# Patient Record
Sex: Female | Born: 1990
Health system: Southern US, Community
[De-identification: ages and names within clinical notes are randomized; demographics above are authoritative.]

## PROBLEM LIST (undated history)

## (undated) DIAGNOSIS — I10 Essential (primary) hypertension: Secondary | ICD-10-CM

## (undated) DIAGNOSIS — D649 Anemia, unspecified: Secondary | ICD-10-CM

## (undated) DIAGNOSIS — J45909 Unspecified asthma, uncomplicated: Secondary | ICD-10-CM

---
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
## 2013-11-26 ENCOUNTER — Emergency Department (INDEPENDENT_AMBULATORY_CARE_PROVIDER_SITE_OTHER)
Admission: EM | Admit: 2013-11-26 | Discharge: 2013-11-26 | Disposition: A | Discharge status: Home / Self Care | Payer: Self-pay | Source: Home / Self Care | Attending: Family Medicine | Admitting: Family Medicine

## 2013-11-26 ENCOUNTER — Encounter (HOSPITAL_COMMUNITY): Payer: Self-pay | Admitting: Emergency Medicine

## 2013-11-26 DIAGNOSIS — J45909 Unspecified asthma, uncomplicated: Secondary | ICD-10-CM

## 2013-11-26 MED ORDER — ALBUTEROL SULFATE HFA 108 (90 BASE) MCG/ACT IN AERS: 1.0000 | INHALATION_SPRAY | Freq: Four times a day (QID) | RESPIRATORY_TRACT | Status: DC | PRN

## 2013-11-26 MED ORDER — IPRATROPIUM-ALBUTEROL 0.5-2.5 (3) MG/3ML IN SOLN
3.0000 mL | Freq: Once | RESPIRATORY_TRACT | Status: AC
  Administered 2013-11-26: 3 mL via RESPIRATORY_TRACT

## 2013-11-26 MED ORDER — ALBUTEROL SULFATE HFA 108 (90 BASE) MCG/ACT IN AERS
INHALATION_SPRAY | RESPIRATORY_TRACT | Status: AC
  Filled 2013-11-26: qty 6.7

## 2013-11-26 MED ORDER — IPRATROPIUM-ALBUTEROL 0.5-2.5 (3) MG/3ML IN SOLN
RESPIRATORY_TRACT | Status: AC
  Filled 2013-11-26: qty 3

## 2013-11-26 MED ORDER — ALBUTEROL SULFATE HFA 108 (90 BASE) MCG/ACT IN AERS: 2.0000 | INHALATION_SPRAY | RESPIRATORY_TRACT | Status: DC

## 2013-11-26 NOTE — ED Notes (Signed)
Pt  Has  A  History  Of  Asthma   She reports  A  Flair  Up that  Has not been releived  By  Her  Inhalers      She  Reports  It  May    Be  A  Flair  Up  Of  Her  Allergies

## 2013-11-26 NOTE — ED Provider Notes (Signed)
Medical screening examination/treatment/procedure(s) were performed by a resident physician or non-physician practitioner and as the supervising physician I was immediately available for consultation/collaboration.  Evan Corey, MD    Evan S Corey, MD 11/26/13 2015 

## 2013-11-26 NOTE — ED Provider Notes (Signed)
CSN: [id number]     Arrival date & time 11/26/13  1039 History   First MD Initiated Contact with Patient 11/26/13 1113     Chief Complaint  Patient presents with  . Shortness of Breath   (Consider location/radiation/quality/duration/timing/severity/associated sxs/prior Treatment) Patient is a 23 y.o. female presenting with shortness of breath. The history is provided by the patient. No language interpreter was used.  Shortness of Breath Severity:  Moderate Onset quality:  Gradual Duration:  1 day Timing:  Constant Progression:  Worsening Chronicity:  New Context: URI   Relieved by:  Nothing Worsened by:  Nothing tried Ineffective treatments:  None tried Associated symptoms: wheezing   Associated symptoms: no chest pain   Risk factors: no tobacco use     Past Medical History  Diagnosis Date  . Asthma   . Hypertension   . Diabetes mellitus   . Anemia    History reviewed. No pertinent past surgical history. No family history on file. History  Substance Use Topics  . Smoking status: Passive Smoke Exposure - Never Smoker    Types: Cigarettes  . Smokeless tobacco: Not on file  . Alcohol Use: No   OB History   Grav Para Term Preterm Abortions TAB SAB Ect Mult Living                 Review of Systems  Respiratory: Positive for shortness of breath and wheezing.   Cardiovascular: Negative for chest pain.  All other systems reviewed and are negative.   Allergies  Review of patient's allergies indicates no known allergies.  Home Medications   Prior to Admission medications   Medication Sig Start Date End Date Taking? Authorizing Provider  albuterol (PROVENTIL HFA;VENTOLIN HFA) 108 (90 BASE) MCG/ACT inhaler Inhale 1 puff into the lungs every 6 (six) hours as needed. For shortness of breath    Historical Provider, MD   BP 142/66  Pulse 98  Temp(Src) 98.6 F (37 C) (Oral)  Resp 20  SpO2 98%  LMP 11/09/2013 Physical Exam  Constitutional: She appears well-developed  and well-nourished.  HENT:  Head: Normocephalic and atraumatic.  Eyes: Conjunctivae are normal. Pupils are equal, round, and reactive to light.  Neck: Normal range of motion. Neck supple.  Cardiovascular: Normal rate and normal heart sounds.   Pulmonary/Chest: Effort normal. She has wheezes.  Abdominal: Soft.  Musculoskeletal: Normal range of motion.  Neurological: She is alert.  Skin: Skin is warm.    ED Course  Procedures (including critical care time) Labs Review Labs Reviewed - No data to display  Imaging Review No results found.   MDM   1. Asthma    Pt given a duoneb.   Pt feels better Rx for albuterol  Albuterol inhaler given here  Elson AreasLeslie K Sofia, PA-C 11/26/13 1228

## 2013-11-26 NOTE — Discharge Instructions (Signed)
Asthma Asthma is a recurring condition in which the airways tighten and narrow. Asthma can make it difficult to breathe. It can cause coughing, wheezing, and shortness of breath. Asthma episodes, also called asthma attacks, range from minor to life-threatening. Asthma cannot be cured, but medicines and lifestyle changes can help control it. CAUSES Asthma is believed to be caused by inherited (genetic) and environmental factors, but its exact cause is unknown. Asthma may be triggered by allergens, lung infections, or irritants in the air. Asthma triggers are different for each person. Common triggers include:   Animal dander.  Dust mites.  Cockroaches.  Pollen from trees or grass.  Mold.  Smoke.  Air pollutants such as dust, household cleaners, hair sprays, aerosol sprays, paint fumes, strong chemicals, or strong odors.  Cold air, weather changes, and winds (which increase molds and pollens in the air).  Strong emotional expressions such as crying or laughing hard.  Stress.  Certain medicines (such as aspirin) or types of drugs (such as beta-blockers).  Sulfites in foods and drinks. Foods and drinks that may contain sulfites include dried fruit, potato chips, and sparkling grape juice.  Infections or inflammatory conditions such as the flu, a cold, or an inflammation of the nasal membranes (rhinitis).  Gastroesophageal reflux disease (GERD).  Exercise or strenuous activity. SYMPTOMS Symptoms may occur immediately after asthma is triggered or many hours later. Symptoms include:  Wheezing.  Excessive nighttime or early morning coughing.  Frequent or severe coughing with a common cold.  Chest tightness.  Shortness of breath. DIAGNOSIS  The diagnosis of asthma is made by a review of your medical history and a physical exam. Tests may also be performed. These may include:  Lung function studies. These tests show how much air you breathe in and out.  Allergy  tests.  Imaging tests such as X-rays. TREATMENT  Asthma cannot be cured, but it can usually be controlled. Treatment involves identifying and avoiding your asthma triggers. It also involves medicines. There are 2 classes of medicine used for asthma treatment:   Controller medicines. These prevent asthma symptoms from occurring. They are usually taken every day.  Reliever or rescue medicines. These quickly relieve asthma symptoms. They are used as needed and provide short-term relief. Your health care provider will help you create an asthma action plan. An asthma action plan is a written plan for managing and treating your asthma attacks. It includes a list of your asthma triggers and how they may be avoided. It also includes information on when medicines should be taken and when their dosage should be changed. An action plan may also involve the use of a device called a peak flow meter. A peak flow meter measures how well the lungs are working. It helps you monitor your condition. HOME CARE INSTRUCTIONS   Take medicine as directed by your health care provider. Speak with your health care provider if you have questions about how or when to take the medicines.  Use a peak flow meter as directed by your health care provider. Record and keep track of readings.  Understand and use the action plan to help minimize or stop an asthma attack without needing to seek medical care.  Control your home environment in the following ways to help prevent asthma attacks:  Do not smoke. Avoid being exposed to secondhand smoke.  Change your heating and air conditioning filter regularly.  Limit your use of fireplaces and wood stoves.  Get rid of pests (such as roaches and mice)  and their droppings.  Throw away plants if you see mold on them.  Clean your floors and dust regularly. Use unscented cleaning products.  Try to have someone else vacuum for you regularly. Stay out of rooms while they are being  vacuumed and for a short while afterward. If you vacuum, use a dust mask from a hardware store, a double-layered or microfilter vacuum cleaner bag, or a vacuum cleaner with a HEPA filter.  Replace carpet with wood, tile, or vinyl flooring. Carpet can trap dander and dust.  Use allergy-proof pillows, mattress covers, and box spring covers.  Wash bed sheets and blankets every week in hot water and dry them in a dryer.  Use blankets that are made of polyester or cotton.  Clean bathrooms and kitchens with bleach. If possible, have someone repaint the walls in these rooms with mold-resistant paint. Keep out of the rooms that are being cleaned and painted.  Wash hands frequently. SEEK MEDICAL CARE IF:   You have wheezing, shortness of breath, or a cough even if taking medicine to prevent attacks.  The colored mucus you cough up (sputum) is thicker than usual.  Your sputum changes from clear or white to yellow, green, gray, or bloody.  You have any problems that may be related to the medicines you are taking (such as a rash, itching, swelling, or trouble breathing).  You are using a reliever medicine more than 2-3 times per week.  Your peak flow is still at 50-79% of your personal best after following your action plan for 1 hour. SEEK IMMEDIATE MEDICAL CARE IF:   You seem to be getting worse and are unresponsive to treatment during an asthma attack.  You are short of breath even at rest.  You get short of breath when doing very little physical activity.  You have difficulty eating, drinking, or talking due to asthma symptoms.  You develop chest pain.  You develop a fast heartbeat.  You have a bluish color to your lips or fingernails.  You are lightheaded, dizzy, or faint.  Your peak flow is less than 50% of your personal best.  You have a fever or persistent symptoms for more than 2-3 days.  You have a fever and symptoms suddenly get worse. MAKE SURE YOU:   Understand  these instructions.  Will watch your condition.  Will get help right away if you are not doing well or get worse. Document Released: 05/29/2005 Document Revised: 06/03/2013 Document Reviewed: 12/26/2012 [facility]Exit[facility] Patient Information 2015 [place], [place]. This information is not intended to replace advice given to you by your health care provider. Make sure you discuss any questions you have with your health care provider.  Asthma Attack Prevention Although there is no way to prevent asthma from starting, you can take steps to control the disease and reduce its symptoms. Learn about your asthma and how to control it. Take an active role to control your asthma by working with your health care provider to create and follow an asthma action plan. An asthma action plan guides you in:  Taking your medicines properly.  Avoiding things that set off your asthma or make your asthma worse (asthma triggers).  Tracking your level of asthma control.  Responding to worsening asthma.  Seeking emergency care when needed. To track your asthma, keep records of your symptoms, check your peak flow number using a handheld device that shows how well air moves out of your lungs (peak flow meter), and get regular asthma checkups.  WHAT ARE SOME WAYS TO PREVENT AN ASTHMA ATTACK?  Take medicines as directed by your health care provider.  Keep track of your asthma symptoms and level of control.  With your health care provider, write a detailed plan for taking medicines and managing an asthma attack. Then be sure to follow your action plan. Asthma is an ongoing condition that needs regular monitoring and treatment.  Identify and avoid asthma triggers. Many outdoor allergens and irritants (such as pollen, mold, cold air, and air pollution) can trigger asthma attacks. Find out what your asthma triggers are and take steps to avoid them.  Monitor your breathing. Learn to recognize warning signs of an attack, such as  coughing, wheezing, or shortness of breath. Your lung function may decrease before you notice any signs or symptoms, so regularly measure and record your peak airflow with a home peak flow meter.  Identify and treat attacks early. If you act quickly, you are less likely to have a severe attack. You will also need less medicine to control your symptoms. When your peak flow measurements decrease and alert you to an upcoming attack, take your medicine as instructed and immediately stop any activity that may have triggered the attack. If your symptoms do not improve, get medical help.  Pay attention to increasing quick-relief inhaler use. If you find yourself relying on your quick-relief inhaler, your asthma is not under control. See your health care provider about adjusting your treatment. WHAT CAN MAKE MY SYMPTOMS WORSE? A number of common things can set off or make your asthma symptoms worse and cause temporary increased inflammation of your airways. Keep track of your asthma symptoms for several weeks, detailing all the environmental and emotional factors that are linked with your asthma. When you have an asthma attack, go back to your asthma diary to see which factor, or combination of factors, might have contributed to it. Once you know what these factors are, you can take steps to control many of them. If you have allergies and asthma, it is important to take asthma prevention steps at home. Minimizing contact with the substance to which you are allergic will help prevent an asthma attack. Some triggers and ways to avoid these triggers are: Animal Dander:  Some people are allergic to the flakes of skin or dried saliva from animals with fur or feathers.   There is no such thing as a hypoallergenic dog or cat breed. All dogs or cats can cause allergies, even if they don't shed.  Keep these pets out of your home.  If you are not able to keep a pet outdoors, keep the pet out of your bedroom and other  sleeping areas at all times, and keep the door closed.  Remove carpets and furniture covered with cloth from your home. If that is not possible, keep the pet away from fabric-covered furniture and carpets. Dust Mites: Many people with asthma are allergic to dust mites. Dust mites are tiny bugs that are found in every home in mattresses, pillows, carpets, fabric-covered furniture, bedcovers, clothes, stuffed toys, and other fabric-covered items.   Cover your mattress in a special dust-proof cover.  Cover your pillow in a special dust-proof cover, or wash the pillow each week in hot water. Water must be hotter than 130 F (54.4 C) to kill dust mites. Cold or warm water used with detergent and bleach can also be effective.  Wash the sheets and blankets on your bed each week in hot water.  Try not  to sleep or lie on cloth-covered cushions.  Call ahead when traveling and ask for a smoke-free hotel room. Bring your own bedding and pillows in case the hotel only supplies feather pillows and down comforters, which may contain dust mites and cause asthma symptoms.  Remove carpets from your bedroom and those laid on concrete, if you can.  Keep stuffed toys out of the bed, or wash the toys weekly in hot water or cooler water with detergent and bleach. Cockroaches: Many people with asthma are allergic to the droppings and remains of cockroaches.   Keep food and garbage in closed containers. Never leave food out.  Use poison baits, traps, powders, gels, or paste (for example, boric acid).  If a spray is used to kill cockroaches, stay out of the room until the odor goes away. Indoor Mold:  Fix leaky faucets, pipes, or other sources of water that have mold around them.  Clean floors and moldy surfaces with a fungicide or diluted bleach.  Avoid using humidifiers, vaporizers, or swamp coolers. These can spread molds through the air. Pollen and Outdoor Mold:  When pollen or mold spore counts are  high, try to keep your windows closed.  Stay indoors with windows closed from late morning to afternoon. Pollen and some mold spore counts are highest at that time.  Ask your health care provider whether you need to take anti-inflammatory medicine or increase your dose of the medicine before your allergy season starts. Other Irritants to Avoid:  Tobacco smoke is an irritant. If you smoke, ask your health care provider how you can quit. Ask family members to quit smoking, too. Do not allow smoking in your home or car.  If possible, do not use a wood-burning stove, kerosene heater, or fireplace. Minimize exposure to all sources of smoke, including incense, candles, fires, and fireworks.  Try to stay away from strong odors and sprays, such as perfume, talcum powder, hair spray, and paints.  Decrease humidity in your home and use an indoor air cleaning device. Reduce indoor humidity to below 60%. Dehumidifiers or central air conditioners can do this.  Decrease house dust exposure by changing furnace and air cooler filters frequently.  Try to have someone else vacuum for you once or twice a week. Stay out of rooms while they are being vacuumed and for a short while afterward.  If you vacuum, use a dust mask from a hardware store, a double-layered or microfilter vacuum cleaner bag, or a vacuum cleaner with a HEPA filter.  Sulfites in foods and beverages can be irritants. Do not drink beer or wine or eat dried fruit, processed potatoes, or shrimp if they cause asthma symptoms.  Cold air can trigger an asthma attack. Cover your nose and mouth with a scarf on cold or windy days.  Several health conditions can make asthma more difficult to manage, including a runny nose, sinus infections, reflux disease, psychological stress, and sleep apnea. Work with your health care provider to manage these conditions.  Avoid close contact with people who have a respiratory infection such as a cold or the flu,  since your asthma symptoms may get worse if you catch the infection. Wash your hands thoroughly after touching items that may have been handled by people with a respiratory infection.  Get a flu shot every year to protect against the flu virus, which often makes asthma worse for days or weeks. Also get a pneumonia shot if you have not previously had one. Unlike the flu  shot, the pneumonia shot does not need to be given yearly. Medicines:  Talk to your health care provider about whether it is safe for you to take aspirin or non-steroidal anti-inflammatory medicines (NSAIDs). In a small number of people with asthma, aspirin and NSAIDs can cause asthma attacks. These medicines must be avoided by people who have known aspirin-sensitive asthma. It is important that people with aspirin-sensitive asthma read labels of all over-the-counter medicines used to treat pain, colds, coughs, and fever.  Beta-blockers and ACE inhibitors are other medicines you should discuss with your health care provider. HOW CAN I FIND OUT WHAT I AM ALLERGIC TO? Ask your asthma health care provider about allergy skin testing or blood testing (the RAST test) to identify the allergens to which you are sensitive. If you are found to have allergies, the most important thing to do is to try to avoid exposure to any allergens that you are sensitive to as much as possible. Other treatments for allergies, such as medicines and allergy shots (immunotherapy) are available.  CAN I EXERCISE? Follow your health care provider's advice regarding asthma treatment before exercising. It is important to maintain a regular exercise program, but vigorous exercise or exercise in cold, humid, or dry environments can cause asthma attacks, especially for those people who have exercise-induced asthma. Document Released: 05/17/2009 Document Revised: 06/03/2013 Document Reviewed: 12/04/2012 [facility] CenterExit[facility] Patient Information 2015 [place], [place]. This information is  not intended to replace advice given to you by your health care provider. Make sure you discuss any questions you have with your health care provider.

## 2014-09-07 ENCOUNTER — Encounter (HOSPITAL_COMMUNITY): Payer: Self-pay | Admitting: Emergency Medicine

## 2014-09-07 ENCOUNTER — Emergency Department (INDEPENDENT_AMBULATORY_CARE_PROVIDER_SITE_OTHER)
Admission: EM | Admit: 2014-09-07 | Discharge: 2014-09-07 | Disposition: A | Discharge status: Home / Self Care | Payer: Self-pay | Source: Home / Self Care | Attending: Emergency Medicine | Admitting: Emergency Medicine

## 2014-09-07 ENCOUNTER — Emergency Department (HOSPITAL_COMMUNITY)
Admission: EM | Admit: 2014-09-07 | Discharge: 2014-09-08 | Disposition: A | Discharge status: Home / Self Care | Payer: Self-pay | Attending: Emergency Medicine | Admitting: Emergency Medicine

## 2014-09-07 ENCOUNTER — Encounter (HOSPITAL_COMMUNITY): Payer: Self-pay | Admitting: *Deleted

## 2014-09-07 ENCOUNTER — Emergency Department (HOSPITAL_COMMUNITY): Payer: Self-pay

## 2014-09-07 DIAGNOSIS — Z79899 Other long term (current) drug therapy: Secondary | ICD-10-CM | POA: Insufficient documentation

## 2014-09-07 DIAGNOSIS — I10 Essential (primary) hypertension: Secondary | ICD-10-CM | POA: Insufficient documentation

## 2014-09-07 DIAGNOSIS — Z862 Personal history of diseases of the blood and blood-forming organs and certain disorders involving the immune mechanism: Secondary | ICD-10-CM | POA: Insufficient documentation

## 2014-09-07 DIAGNOSIS — J45901 Unspecified asthma with (acute) exacerbation: Secondary | ICD-10-CM | POA: Insufficient documentation

## 2014-09-07 DIAGNOSIS — J069 Acute upper respiratory infection, unspecified: Secondary | ICD-10-CM | POA: Insufficient documentation

## 2014-09-07 DIAGNOSIS — R0602 Shortness of breath: Secondary | ICD-10-CM

## 2014-09-07 DIAGNOSIS — R000 Tachycardia, unspecified: Secondary | ICD-10-CM | POA: Insufficient documentation

## 2014-09-07 DIAGNOSIS — B349 Viral infection, unspecified: Secondary | ICD-10-CM

## 2014-09-07 DIAGNOSIS — E119 Type 2 diabetes mellitus without complications: Secondary | ICD-10-CM | POA: Insufficient documentation

## 2014-09-07 LAB — CBC WITH DIFFERENTIAL/PLATELET
BASOS ABS: 0 10*3/uL (ref 0.0–0.1)
Basophils Relative: 0 % (ref 0–1)
EOS PCT: 0 % (ref 0–5)
Eosinophils Absolute: 0 10*3/uL (ref 0.0–0.7)
HEMATOCRIT: 34.2 % — AB (ref 36.0–46.0)
Hemoglobin: 11.2 g/dL — ABNORMAL LOW (ref 12.0–15.0)
LYMPHS PCT: 5 % — AB (ref 12–46)
Lymphs Abs: 0.7 10*3/uL (ref 0.7–4.0)
MCH: 25.5 pg — ABNORMAL LOW (ref 26.0–34.0)
MCHC: 32.7 g/dL (ref 30.0–36.0)
MCV: 77.9 fL — AB (ref 78.0–100.0)
MONO ABS: 0.7 10*3/uL (ref 0.1–1.0)
Monocytes Relative: 5 % (ref 3–12)
NEUTROS ABS: 12.3 10*3/uL — AB (ref 1.7–7.7)
Neutrophils Relative %: 90 % — ABNORMAL HIGH (ref 43–77)
PLATELETS: 268 10*3/uL (ref 150–400)
RBC: 4.39 MIL/uL (ref 3.87–5.11)
RDW: 13.4 % (ref 11.5–15.5)
WBC: 13.8 10*3/uL — ABNORMAL HIGH (ref 4.0–10.5)

## 2014-09-07 MED ORDER — ALBUTEROL SULFATE (2.5 MG/3ML) 0.083% IN NEBU
5.0000 mg | INHALATION_SOLUTION | Freq: Once | RESPIRATORY_TRACT | Status: AC
  Administered 2014-09-07: 5 mg via RESPIRATORY_TRACT
  Filled 2014-09-07: qty 6

## 2014-09-07 MED ORDER — PREDNISONE 20 MG PO TABS
60.0000 mg | ORAL_TABLET | Freq: Once | ORAL | Status: AC
  Administered 2014-09-07: 60 mg via ORAL
  Filled 2014-09-07: qty 3

## 2014-09-07 MED ORDER — IPRATROPIUM BROMIDE 0.02 % IN SOLN
0.5000 mg | Freq: Once | RESPIRATORY_TRACT | Status: AC
  Administered 2014-09-07: 0.5 mg via RESPIRATORY_TRACT
  Filled 2014-09-07: qty 2.5

## 2014-09-07 MED ORDER — IBUPROFEN 800 MG PO TABS: 800.0000 mg | ORAL_TABLET | Freq: Three times a day (TID) | ORAL | Status: DC

## 2014-09-07 MED ORDER — ACETAMINOPHEN 325 MG PO TABS
650.0000 mg | ORAL_TABLET | Freq: Once | ORAL | Status: AC
  Administered 2014-09-07: 650 mg via ORAL
  Filled 2014-09-07: qty 2

## 2014-09-07 MED ORDER — SODIUM CHLORIDE 0.9 % IV BOLUS (SEPSIS)
1000.0000 mL | Freq: Once | INTRAVENOUS | Status: AC
  Administered 2014-09-07: 1000 mL via INTRAVENOUS

## 2014-09-07 MED ORDER — BENZONATATE 100 MG PO CAPS: 100.0000 mg | ORAL_CAPSULE | Freq: Three times a day (TID) | ORAL | Status: DC

## 2014-09-07 NOTE — Discharge Instructions (Signed)
Cough, Adult ° A cough is a reflex that helps clear your throat and airways. It can help heal the body or may be a reaction to an irritated airway. A cough may only last 2 or 3 weeks (acute) or may last more than 8 weeks (chronic).  °CAUSES °Acute cough: °· Viral or bacterial infections. °Chronic cough: °· Infections. °· Allergies. °· Asthma. °· Post-nasal drip. °· Smoking. °· Heartburn or acid reflux. °· Some medicines. °· Chronic lung problems (COPD). °· Cancer. °SYMPTOMS  °· Cough. °· Fever. °· Chest pain. °· Increased breathing rate. °· High-pitched whistling sound when breathing (wheezing). °· Colored mucus that you cough up (sputum). °TREATMENT  °· A bacterial cough may be treated with antibiotic medicine. °· A viral cough must run its course and will not respond to antibiotics. °· Your caregiver may recommend other treatments if you have a chronic cough. °HOME CARE INSTRUCTIONS  °· Only take over-the-counter or prescription medicines for pain, discomfort, or fever as directed by your caregiver. Use cough suppressants only as directed by your caregiver. °· Use a cold steam vaporizer or humidifier in your bedroom or home to help loosen secretions. °· Sleep in a semi-upright position if your cough is worse at night. °· Rest as needed. °· Stop smoking if you smoke. °SEEK IMMEDIATE MEDICAL CARE IF:  °· You have pus in your sputum. °· Your cough starts to worsen. °· You cannot control your cough with suppressants and are losing sleep. °· You begin coughing up blood. °· You have difficulty breathing. °· You develop pain which is getting worse or is uncontrolled with medicine. °· You have a fever. °MAKE SURE YOU:  °· Understand these instructions. °· Will watch your condition. °· Will get help right away if you are not doing well or get worse. °Document Released: 11/25/2010 Document Revised: 08/21/2011 Document Reviewed: 11/25/2010 °ExitCare® Patient Information ©2015 [employer]. This information is not intended  to replace advice given to you by your health care provider. Make sure you discuss any questions you have with your health care provider. °Viral Infections °A virus is a type of germ. Viruses can cause: °· Minor sore throats. °· Aches and pains. °· Headaches. °· Runny nose. °· Rashes. °· Watery eyes. °· Tiredness. °· Coughs. °· Loss of appetite. °· Feeling sick to your stomach (nausea). °· Throwing up (vomiting). °· Watery poop (diarrhea). °HOME CARE  °· Only take medicines as told by your doctor. °· Drink enough water and fluids to keep your pee (urine) clear or pale yellow. Sports drinks are a good choice. °· Get plenty of rest and eat healthy. Soups and broths with crackers or rice are fine. °GET HELP RIGHT AWAY IF:  °· You have a very bad headache. °· You have shortness of breath. °· You have chest pain or neck pain. °· You have an unusual rash. °· You cannot stop throwing up. °· You have watery poop that does not stop. °· You cannot keep fluids down. °· You or your child has a temperature by mouth above 102° F (38.9° C), not controlled by medicine. °· Your baby is older than 3 months with a rectal temperature of 102° F (38.9° C) or higher. °· Your baby is 3 months old or younger with a rectal temperature of 100.4° F (38° C) or higher. °MAKE SURE YOU:  °· Understand these instructions. °· Will watch this condition. °· Will get help right away if you are not doing well or get worse. °Document Released:   05/11/2008 Document Revised: 08/21/2011 Document Reviewed: 10/04/2010 ExitCare Patient Information 2015 [place], [place]. This information is not intended to replace advice given to you by your health care provider. Make sure you discuss any questions you have with your health care provider.

## 2014-09-07 NOTE — ED Notes (Signed)
Pt states she has had a sore throat, cough, and fever since Saturday. Alert and oriented.

## 2014-09-07 NOTE — ED Provider Notes (Signed)
CSN: [id number]     Arrival date & time 09/07/14  1417 History   First MD Initiated Contact with Patient 09/07/14 1458     Chief Complaint  Patient presents with  . URI   (Consider location/radiation/quality/duration/timing/severity/associated sxs/prior Treatment) Patient is a 24 y.o. female presenting with URI. The history is provided by the patient. No language interpreter was used.  URI Presenting symptoms: congestion and cough   Severity:  Moderate Onset quality:  Gradual Duration:  3 days Timing:  Constant Progression:  Worsening Chronicity:  New Relieved by:  Nothing Worsened by:  Nothing tried Ineffective treatments:  None tried Associated symptoms: no sinus pain and no sneezing   Risk factors: no sick contacts     Past Medical History  Diagnosis Date  . Asthma   . Hypertension   . Diabetes mellitus   . Anemia    No past surgical history on file. No family history on file. History  Substance Use Topics  . Smoking status: Passive Smoke Exposure - Never Smoker    Types: Cigarettes  . Smokeless tobacco: Not on file  . Alcohol Use: No   OB History    No data available     Review of Systems  HENT: Positive for congestion. Negative for sneezing.   Respiratory: Positive for cough.   All other systems reviewed and are negative.   Allergies  Review of patient's allergies indicates no known allergies.  Home Medications   Prior to Admission medications   Medication Sig Start Date End Date Taking? Authorizing Provider  albuterol (PROVENTIL HFA;VENTOLIN HFA) 108 (90 BASE) MCG/ACT inhaler Inhale 1 puff into the lungs every 6 (six) hours as needed. For shortness of breath 11/26/13   Elson AreasLeslie K Orrin Yurkovich, PA-C   BP 124/83 mmHg  Pulse 119  Temp(Src) 101.2 F (38.4 C) (Oral)  Resp 18  SpO2 98% Physical Exam  Constitutional: She appears well-developed and well-nourished.  HENT:  Head: Normocephalic.  Right Ear: External ear normal.  Left Ear: External ear normal.   Eyes: Pupils are equal, round, and reactive to light.  Neck: Neck supple.  Cardiovascular: Normal rate.   Pulmonary/Chest: Effort normal.  Abdominal: Soft.  Musculoskeletal: Normal range of motion.  Skin: Skin is warm.  Psychiatric: She has a normal mood and affect.  Nursing note and vitals reviewed.   ED Course  Procedures (including critical care time) Labs Review Labs Reviewed - No data to display  Imaging Review No results found.   MDM Pt counseled  I suspect viral illness.  Pt given rx for ibuprofen and tessalon perles.   1. Viral illness      Elson AreasLeslie K Aimy Sweeting, PA-C 09/07/14 1535

## 2014-09-07 NOTE — ED Provider Notes (Signed)
CSN: [id number]     Arrival date & time 09/07/14  2224 History   First MD Initiated Contact with Patient 09/07/14 2257     Chief Complaint  Patient presents with  . Sore Throat  . Cough  . Fever     (Consider location/radiation/quality/duration/timing/severity/associated sxs/prior Treatment) HPI    PCP: REDMON,NOELLE, PA-C Blood pressure 131/65, pulse 141, temperature 100.9 F (38.3 C), temperature source Oral, last menstrual period 08/10/2014, SpO2 99 %.  Marilyn Wiley is a 24 y.o.female with a significant PMH of asthma, hypertension, diabetes, anemia  presents to the ER with complaints of chills, nausea, diaphoresis and fever at home. She also has had a mildly scratchy throat and cough. She was seen in the ER earlier today by an UC provider for URI symptoms of congestion and cough. She has been sick for the past 3 days with worsening symptoms of congestion and cough. No vomiting or diarrhea. In Triage she is febrile at 100.9 and tachycardic at 141.   Negative Review of Symptoms: No LE swelling, no CP, no back pain, dysuria, or headache, neck pain, confusion, rash, myalgias/arthralgias.   Past Medical History  Diagnosis Date  . Asthma   . Hypertension   . Diabetes mellitus   . Anemia    History reviewed. No pertinent past surgical history. History reviewed. No pertinent family history. History  Substance Use Topics  . Smoking status: Passive Smoke Exposure - Never Smoker    Types: Cigarettes  . Smokeless tobacco: Not on file  . Alcohol Use: No   OB History    No data available     Review of Systems  10 Systems reviewed and are negative for acute change except as noted in the HPI.     Allergies  Review of patient's allergies indicates no known allergies.  Home Medications   Prior to Admission medications   Medication Sig Start Date End Date Taking? Authorizing Provider  albuterol (PROVENTIL HFA;VENTOLIN HFA) 108 (90 BASE) MCG/ACT inhaler Inhale 1 puff into  the lungs every 6 (six) hours as needed. For shortness of breath 11/26/13  Yes Elson Areas, PA-C  benzonatate (TESSALON) 100 MG capsule Take 1 capsule (100 mg total) by mouth every 8 (eight) hours. 09/07/14  Yes Lonia Skinner Sofia, PA-C  ibuprofen (ADVIL,MOTRIN) 800 MG tablet Take 1 tablet (800 mg total) by mouth 3 (three) times daily. 09/07/14  Yes Elson Areas, PA-C  norethindrone-ethinyl estradiol (OVCON-35,BALZIVA,BRIELLYN) 0.4-35 MG-MCG tablet Take 1 tablet by mouth daily.   Yes Historical Provider, MD  OVER THE COUNTER MEDICATION Take 2 capsules by mouth daily. OTC. The ODD SHOT Weight LOss MEdication   Yes Historical Provider, MD   BP 131/65 mmHg  Pulse 141  Temp(Src) 100.9 F (38.3 C) (Oral)  SpO2 99%  LMP 08/10/2014 Physical Exam  Constitutional: She appears well-developed and well-nourished. She appears distressed.  HENT:  Head: Normocephalic and atraumatic.  Right Ear: Tympanic membrane and ear canal normal.  Left Ear: Tympanic membrane and ear canal normal.  Nose: Nose normal.  Mouth/Throat: Uvula is midline, oropharynx is clear and moist and mucous membranes are normal.  Eyes: Pupils are equal, round, and reactive to light.  Neck: Normal range of motion. Neck supple.  Cardiovascular: Regular rhythm.  Tachycardia present.   Pulmonary/Chest: Effort normal. No respiratory distress. She has wheezes (diffuse).  Abdominal: Soft. Bowel sounds are normal. She exhibits no distension. There is no tenderness. There is no rigidity.  Neurological: She is alert.  Skin: Skin  is warm. No rash noted. She is diaphoretic.  Psychiatric: She has a normal mood and affect. Her speech is normal.  Nursing note and vitals reviewed.   ED Course  Procedures (including critical care time) Labs Review Labs Reviewed  RAPID STREP SCREEN  CBC WITH DIFFERENTIAL/PLATELET  BASIC METABOLIC PANEL    Imaging Review No results found.   EKG Interpretation None      MDM   Final diagnoses:   Shortness of breath    Pt is tachycardic and has significant wheezing with fever. She was seen today at the Urgent Care and is now complaining of being significantly worse.  Medications  sodium chloride 0.9 % bolus 1,000 mL (1,000 mLs Intravenous New Bag/Given 09/07/14 2315)  acetaminophen (TYLENOL) tablet 650 mg (650 mg Oral Given 09/07/14 2333)  predniSONE (DELTASONE) tablet 60 mg (60 mg Oral Given 09/07/14 2333)  albuterol (PROVENTIL) (2.5 MG/3ML) 0.083% nebulizer solution 5 mg (5 mg Nebulization Given 09/07/14 2333)  ipratropium (ATROVENT) nebulizer solution 0.5 mg (0.5 mg Nebulization Given 09/07/14 2333)    1:45 am The patient has a negative urine preg, and lactic acid. She has had a CBC which shows a mildly elevated WBC and a mildly abnormal BMP.  Her chest xray shows no acute pulmonary process. Unfortunately despite 2 L of fluid the patient continues to be tachycardic - initially at 141 and now at 126.    2:30 am At the end of shift, patient handed off to Genuine PartsShari Upstill, PA-C. D-dimer is pending. Strep screen is pending. If these are normal and fever/tachycardia continues to be improved patient can be discharged to f/u with PCP. Dicussed plan with patient after sign out, well appearing and symptoms improved.  Filed Vitals:   09/08/14 0146  BP: 128/48  Pulse: 126  Temp: 98.6 F (37 C)  Resp: [id number]      Kayde Atkerson, PA-C 09/08/14 0231  Paula LibraJohn Molpus, MD 09/08/14 [phone]

## 2014-09-07 NOTE — ED Notes (Signed)
PT   REPORTS  SYMPTOMS  OF  COUGH  / CONGESTED    RUNNY  NOSE    AS  WELL  AS        HEADACHES   /    CHILLS        WITH    SNEEZING     pT  REPORTS  SYMPTOMS BEGAN       ABOUT  3  DAYS  AGO      PT  AMBULATED  TO  ROOM  WITH  A  STEADY  FLYUID GAIT  SPEAKING IN  COMPLETE  SENTANCES  IN NO ACUTE  DISTRESS

## 2014-09-08 ENCOUNTER — Emergency Department (HOSPITAL_COMMUNITY): Payer: Self-pay

## 2014-09-08 ENCOUNTER — Encounter (HOSPITAL_COMMUNITY): Payer: Self-pay

## 2014-09-08 LAB — BASIC METABOLIC PANEL
ANION GAP: 11 (ref 5–15)
BUN: <5 mg/dL — ABNORMAL LOW (ref 6–23)
CO2: 21 mmol/L (ref 19–32)
CREATININE: 0.92 mg/dL (ref 0.50–1.10)
Calcium: 8.8 mg/dL (ref 8.4–10.5)
Chloride: 101 mmol/L (ref 96–112)
GFR calc Af Amer: >90 mL/min (ref >90)
GFR, EST NON AFRICAN AMERICAN: 88 mL/min — AB (ref >90)
Glucose, Bld: 123 mg/dL — ABNORMAL HIGH (ref 70–99)
Potassium: 3.2 mmol/L — ABNORMAL LOW (ref 3.5–5.1)
Sodium: 133 mmol/L — ABNORMAL LOW (ref 135–145)

## 2014-09-08 LAB — RAPID STREP SCREEN (MED CTR MEBANE ONLY): STREPTOCOCCUS, GROUP A SCREEN (DIRECT): NEGATIVE

## 2014-09-08 LAB — POC URINE PREG, ED: Preg Test, Ur: NEGATIVE

## 2014-09-08 LAB — D-DIMER, QUANTITATIVE: D-Dimer, Quant: 0.98 ug/mL-FEU — ABNORMAL HIGH (ref 0.00–0.48)

## 2014-09-08 LAB — I-STAT CG4 LACTIC ACID, ED: Lactic Acid, Venous: 1.57 mmol/L (ref 0.5–2.0)

## 2014-09-08 MED ORDER — IOHEXOL 350 MG/ML SOLN
100.0000 mL | Freq: Once | INTRAVENOUS | Status: AC | PRN
  Administered 2014-09-08: 100 mL via INTRAVENOUS

## 2014-09-08 MED ORDER — SODIUM CHLORIDE 0.9 % IV BOLUS (SEPSIS)
1000.0000 mL | Freq: Once | INTRAVENOUS | Status: AC
  Administered 2014-09-08: 1000 mL via INTRAVENOUS

## 2014-09-08 NOTE — Discharge Instructions (Signed)
Upper Respiratory Infection, Adult An upper respiratory infection (URI) is also sometimes known as the common cold. The upper respiratory tract includes the nose, sinuses, throat, trachea, and bronchi. Bronchi are the airways leading to the lungs. Most people improve within 1 week, but symptoms can last up to 2 weeks. A residual cough may last even longer.  CAUSES Many different viruses can infect the tissues lining the upper respiratory tract. The tissues become irritated and inflamed and often become very moist. Mucus production is also common. A cold is contagious. You can easily spread the virus to others by oral contact. This includes kissing, sharing a glass, coughing, or sneezing. Touching your mouth or nose and then touching a surface, which is then touched by another person, can also spread the virus. SYMPTOMS  Symptoms typically develop 1 to 3 days after you come in contact with a cold virus. Symptoms vary from person to person. They may include:  Runny nose.  Sneezing.  Nasal congestion.  Sinus irritation.  Sore throat.  Loss of voice (laryngitis).  Cough.  Fatigue.  Muscle aches.  Loss of appetite.  Headache.  Low-grade fever. DIAGNOSIS  You might diagnose your own cold based on familiar symptoms, since most people get a cold 2 to 3 times a year. Your caregiver can confirm this based on your exam. Most importantly, your caregiver can check that your symptoms are not due to another disease such as strep throat, sinusitis, pneumonia, asthma, or epiglottitis. Blood tests, throat tests, and X-rays are not necessary to diagnose a common cold, but they may sometimes be helpful in excluding other more serious diseases. Your caregiver will decide if any further tests are required. RISKS AND COMPLICATIONS  You may be at risk for a more severe case of the common cold if you smoke cigarettes, have chronic heart disease (such as heart failure) or lung disease (such as asthma), or if  you have a weakened immune system. The very young and very old are also at risk for more serious infections. Bacterial sinusitis, middle ear infections, and bacterial pneumonia can complicate the common cold. The common cold can worsen asthma and chronic obstructive pulmonary disease (COPD). Sometimes, these complications can require emergency medical care and may be life-threatening. PREVENTION  The best way to protect against getting a cold is to practice good hygiene. Avoid oral or hand contact with people with cold symptoms. Wash your hands often if contact occurs. There is no clear evidence that vitamin C, vitamin E, echinacea, or exercise reduces the chance of developing a cold. However, it is always recommended to get plenty of rest and practice good nutrition. TREATMENT  Treatment is directed at relieving symptoms. There is no cure. Antibiotics are not effective, because the infection is caused by a virus, not by bacteria. Treatment may include:  Increased fluid intake. Sports drinks offer valuable electrolytes, sugars, and fluids.  Breathing heated mist or steam (vaporizer or shower).  Eating chicken soup or other clear broths, and maintaining good nutrition.  Getting plenty of rest.  Using gargles or lozenges for comfort.  Controlling fevers with ibuprofen or acetaminophen as directed by your caregiver.  Increasing usage of your inhaler if you have asthma. Zinc gel and zinc lozenges, taken in the first 24 hours of the common cold, can shorten the duration and lessen the severity of symptoms. Pain medicines may help with fever, muscle aches, and throat pain. A variety of non-prescription medicines are available to treat congestion and runny nose. Your caregiver   can make recommendations and may suggest nasal or lung inhalers for other symptoms.  HOME CARE INSTRUCTIONS   Only take over-the-counter or prescription medicines for pain, discomfort, or fever as directed by your  caregiver.  Use a warm mist humidifier or inhale steam from a shower to increase air moisture. This may keep secretions moist and make it easier to breathe.  Drink enough water and fluids to keep your urine clear or pale yellow.  Rest as needed.  Return to work when your temperature has returned to normal or as your caregiver advises. You may need to stay home longer to avoid infecting others. You can also use a face mask and careful hand washing to prevent spread of the virus. SEEK MEDICAL CARE IF:   After the first few days, you feel you are getting worse rather than better.  You need your caregiver's advice about medicines to control symptoms.  You develop chills, worsening shortness of breath, or brown or red sputum. These may be signs of pneumonia.  You develop yellow or brown nasal discharge or pain in the face, especially when you bend forward. These may be signs of sinusitis.  You develop a fever, swollen neck glands, pain with swallowing, or white areas in the back of your throat. These may be signs of strep throat. SEEK IMMEDIATE MEDICAL CARE IF:   You have a fever.  You develop severe or persistent headache, ear pain, sinus pain, or chest pain.  You develop wheezing, a prolonged cough, cough up blood, or have a change in your usual mucus (if you have chronic lung disease).  You develop sore muscles or a stiff neck. Document Released: 11/22/2000 Document Revised: 08/21/2011 Document Reviewed: 09/03/2013 Exit[employer] Patient Information 2015 [employer]. This information is not intended to replace advice given to you by your health care provider. Make sure you discuss any questions you have with your health care provider.  

## 2014-09-08 NOTE — ED Provider Notes (Signed)
Cough, congestion, fever. Sent home from [facility] with Tessalon, supportive care.  Subsequent rigors, chills, fever. Here tachy to 140, sore throat, cough, congestion. CXR, lactic, BMET - negative. 3L provided, still tachy to 120. Strep, d-dimer pending. Patient urinating. Re-evaluate after final labs.   D-dimer positive (.98). Will obtain CT angio r/o PE.  CT Angio negative. She is feeling much better with IV fluids. Heart rate significantly improved. Feel she is stable for discharge home.   Elpidio AnisShari Kazuo Durnil, PA-C 09/08/14 [phone]

## 2014-09-10 LAB — CULTURE, GROUP A STREP

## 2015-04-12 ENCOUNTER — Emergency Department (HOSPITAL_COMMUNITY)
Admission: EM | Admit: 2015-04-12 | Discharge: 2015-04-12 | Disposition: A | Discharge status: Home / Self Care | Payer: Self-pay | Attending: Emergency Medicine | Admitting: Emergency Medicine

## 2015-04-12 ENCOUNTER — Encounter (HOSPITAL_COMMUNITY): Payer: Self-pay | Admitting: Emergency Medicine

## 2015-04-12 ENCOUNTER — Emergency Department (HOSPITAL_COMMUNITY): Payer: Self-pay

## 2015-04-12 DIAGNOSIS — S8992XA Unspecified injury of left lower leg, initial encounter: Secondary | ICD-10-CM | POA: Insufficient documentation

## 2015-04-12 DIAGNOSIS — Y9339 Activity, other involving climbing, rappelling and jumping off: Secondary | ICD-10-CM | POA: Insufficient documentation

## 2015-04-12 DIAGNOSIS — Y998 Other external cause status: Secondary | ICD-10-CM | POA: Insufficient documentation

## 2015-04-12 DIAGNOSIS — Z791 Long term (current) use of non-steroidal anti-inflammatories (NSAID): Secondary | ICD-10-CM | POA: Insufficient documentation

## 2015-04-12 DIAGNOSIS — Z79899 Other long term (current) drug therapy: Secondary | ICD-10-CM | POA: Insufficient documentation

## 2015-04-12 DIAGNOSIS — X58XXXA Exposure to other specified factors, initial encounter: Secondary | ICD-10-CM | POA: Insufficient documentation

## 2015-04-12 DIAGNOSIS — I10 Essential (primary) hypertension: Secondary | ICD-10-CM | POA: Insufficient documentation

## 2015-04-12 DIAGNOSIS — Y9289 Other specified places as the place of occurrence of the external cause: Secondary | ICD-10-CM | POA: Insufficient documentation

## 2015-04-12 DIAGNOSIS — J45909 Unspecified asthma, uncomplicated: Secondary | ICD-10-CM | POA: Insufficient documentation

## 2015-04-12 MED ORDER — IBUPROFEN 800 MG PO TABS
800.0000 mg | ORAL_TABLET | Freq: Once | ORAL | Status: AC
  Administered 2015-04-12: 800 mg via ORAL

## 2015-04-12 MED ORDER — IBUPROFEN 800 MG PO TABS: 800.0000 mg | ORAL_TABLET | Freq: Three times a day (TID) | ORAL | Status: DC

## 2015-04-12 MED ORDER — HYDROCODONE-ACETAMINOPHEN 5-325 MG PO TABS: 2.0000 | ORAL_TABLET | ORAL | Status: DC | PRN

## 2015-04-12 NOTE — ED Notes (Signed)
Patient was alert, oriented and stable upon discharge. RN went over AVS and patient had no further questions.  

## 2015-04-12 NOTE — ED Notes (Signed)
Pt states she is having left knee pain  Pt states it was hurting her a little last week so she took some time off from working out  Pt states today her knee popped and now it feels unstable to stand on  Pt states she is unable to stand on it at this time

## 2015-04-12 NOTE — Discharge Instructions (Signed)
Take vicodin and ibuprofen as needed for pain. Rest, ice, and elevate your injury. Refer to attached documents for more information. Follow up with Dr. Magnus IvanBlackman if symptoms persist.

## 2015-04-12 NOTE — ED Provider Notes (Signed)
History  By signing my name below, I, Karle PlumberJennifer Tensley, attest that this documentation has been prepared under the direction and in the presence of AK [employer] Holding CorporationKaitlyn Josceline Chenard, PA-C. Electronically Signed: Karle PlumberJennifer Tensley, ED Scribe. 04/12/2015. 9:33 PM.  Chief Complaint  Patient presents with  . Knee Pain   The history is provided by the patient and medical records. No language interpreter was used.    HPI Comments:  Marilyn Wiley is a 24 y.o. female who presents to the Emergency Department complaining of moderate left knee pain that started a few hours ago. She states she was doing jumping jacks and landed on the leg wrong. She states that most of the pain is located laterally. She has not done anything to treat the pain. Moving the leg or bearing weight makes the pain worse. She denies numbness, tingling or weakness of the LLE, bruising or wounds.   Past Medical History  Diagnosis Date  . Asthma   . Hypertension   . Anemia    History reviewed. No pertinent past surgical history. Family History  Problem Relation Age of Onset  . Diabetes Mother   . Diabetes Other   . Cancer Other    Social History  Substance Use Topics  . Smoking status: Passive Smoke Exposure - Never Smoker    Types: Cigarettes  . Smokeless tobacco: None  . Alcohol Use: Yes     Comment: social    OB History    No data available     Review of Systems  Musculoskeletal: Positive for arthralgias.  All other systems reviewed and are negative.   Allergies  Review of patient's allergies indicates no known allergies.  Home Medications   Prior to Admission medications   Medication Sig Start Date End Date Taking? Authorizing Provider  albuterol (PROVENTIL HFA;VENTOLIN HFA) 108 (90 BASE) MCG/ACT inhaler Inhale 1 puff into the lungs every 6 (six) hours as needed. For shortness of breath 11/26/13   Elson AreasLeslie K Sofia, PA-C  benzonatate (TESSALON) 100 MG capsule Take 1 capsule (100 mg total) by mouth every 8 (eight)  hours. 09/07/14   Elson AreasLeslie K Sofia, PA-C  ibuprofen (ADVIL,MOTRIN) 800 MG tablet Take 1 tablet (800 mg total) by mouth 3 (three) times daily. 09/07/14   Elson AreasLeslie K Sofia, PA-C  norethindrone-ethinyl estradiol (OVCON-35,BALZIVA,BRIELLYN) 0.4-35 MG-MCG tablet Take 1 tablet by mouth daily.    Historical Provider, MD  OVER THE COUNTER MEDICATION Take 2 capsules by mouth daily. OTC. The ODD SHOT Weight LOss MEdication    Historical Provider, MD   Triage Vitals: BP 142/58 mmHg  Pulse 98  Temp(Src) 98.6 F (37 C) (Oral)  Resp 18  Ht 5\' 4"  (1.626 m)  Wt 184 lb (83.462 kg)  BMI 31.57 kg/m2  SpO2 100%  LMP 03/24/2015 (Exact Date) Physical Exam  Constitutional: She is oriented to person, place, and time. She appears well-developed and well-nourished. No distress.  HENT:  Head: Normocephalic and atraumatic.  Eyes: Conjunctivae and EOM are normal.  Normal appearance  Neck: Normal range of motion.  Cardiovascular: Normal rate and regular rhythm.  Exam reveals no gallop and no friction rub.   No murmur heard. Pulmonary/Chest: Effort normal and breath sounds normal. She has no wheezes. She has no rales. She exhibits no tenderness.  Abdominal: Soft. She exhibits no distension. There is no tenderness. There is no rebound.  Musculoskeletal: Normal range of motion.  Limited flexion of the left knee with lateral left knee tenderness to palpation. No obvious deformity.   Neurological: She  is alert and oriented to person, place, and time. Coordination normal.  Speech is goal-oriented. Moves limbs without ataxia.   Skin: Skin is warm and dry.  Psychiatric: She has a normal mood and affect. Her behavior is normal.  Nursing note and vitals reviewed.   ED Course  Procedures (including critical care time)   SPLINT APPLICATION Date/Time: 9:28 PM Authorized by: Emilia Beck Consent: Verbal consent obtained. Risks and benefits: risks, benefits and alternatives were discussed Consent given by:  patient Splint applied by: orthopedic technician Location details: left knee Splint type: knee immobilizer Supplies used: knee immobilizer Post-procedure: The splinted body part was neurovascularly unchanged following the procedure. Patient tolerance: Patient tolerated the procedure well with no immediate complications.     DIAGNOSTIC STUDIES: Oxygen Saturation is 100% on RA, normal by my interpretation.   COORDINATION OF CARE: 9:21 PM- Will order knee immobilizer, crutches and give referral to orthopedist. Will prescribe pain medication. Pt verbalizes understanding and agrees to plan.  Medications - No data to display  Labs Review Labs Reviewed - No data to display  Imaging Review Dg Knee Complete 4 Views Left  04/12/2015  CLINICAL DATA:  Left knee pain following jumping jacks, initial encounter EXAM: LEFT KNEE - COMPLETE 4+ VIEW COMPARISON:  None. FINDINGS: There is no evidence of fracture, dislocation, or joint effusion. There is no evidence of arthropathy or other focal bone abnormality. Soft tissues are unremarkable. IMPRESSION: No acute abnormality noted. Electronically Signed   By: Alcide Clever M.D.   On: 04/12/2015 20:04   I have personally reviewed and evaluated these images and lab results as part of my medical decision-making.   EKG Interpretation None      MDM   Final diagnoses:  Left knee injury, initial encounter    9:29 PM Patient is unable to bear weight. Patient's xray unremarkable for acute changes. Patient has significant tenderness to the lateral aspect of the left knee. Patient will have knee immobilizer and crutches and orthopedic follow up.   I personally performed the services described in this documentation, which was scribed in my presence. The recorded information has been reviewed and is accurate.     Emilia Beck, PA-C 04/12/15 1610  Bethann Berkshire, MD 04/12/15 [phone]

## 2015-10-04 ENCOUNTER — Ambulatory Visit: Payer: Self-pay | Admitting: Allergy and Immunology

## 2015-10-07 ENCOUNTER — Ambulatory Visit (INDEPENDENT_AMBULATORY_CARE_PROVIDER_SITE_OTHER): Payer: 59 | Admitting: Allergy and Immunology

## 2015-10-07 ENCOUNTER — Encounter: Payer: Self-pay | Admitting: Allergy and Immunology

## 2015-10-07 VITALS — BP 110/68 | HR 80 | Temp 98.4°F | Resp 18 | Ht 64.0 in | Wt 188.1 lb

## 2015-10-07 DIAGNOSIS — J3089 Other allergic rhinitis: Secondary | ICD-10-CM | POA: Insufficient documentation

## 2015-10-07 DIAGNOSIS — J4530 Mild persistent asthma, uncomplicated: Secondary | ICD-10-CM | POA: Insufficient documentation

## 2015-10-07 DIAGNOSIS — H1045 Other chronic allergic conjunctivitis: Secondary | ICD-10-CM | POA: Diagnosis not present

## 2015-10-07 DIAGNOSIS — H1010 Acute atopic conjunctivitis, unspecified eye: Secondary | ICD-10-CM | POA: Insufficient documentation

## 2015-10-07 MED ORDER — ALBUTEROL SULFATE 108 (90 BASE) MCG/ACT IN AEPB: 2.0000 | INHALATION_SPRAY | RESPIRATORY_TRACT | Status: AC | PRN

## 2015-10-07 MED ORDER — LEVOCETIRIZINE DIHYDROCHLORIDE 5 MG PO TABS: 5.0000 mg | ORAL_TABLET | Freq: Every day | ORAL | Status: DC

## 2015-10-07 MED ORDER — MONTELUKAST SODIUM 10 MG PO TABS: 10.0000 mg | ORAL_TABLET | Freq: Every day | ORAL | Status: DC

## 2015-10-07 MED ORDER — FLUTICASONE PROPIONATE 50 MCG/ACT NA SUSP: 2.0000 | Freq: Every day | NASAL | Status: DC

## 2015-10-07 MED ORDER — OLOPATADINE HCL 0.7 % OP SOLN: 1.0000 [drp] | OPHTHALMIC | Status: DC

## 2015-10-07 MED ORDER — BECLOMETHASONE DIPROPIONATE 40 MCG/ACT IN AERS: 2.0000 | INHALATION_SPRAY | Freq: Two times a day (BID) | RESPIRATORY_TRACT | Status: DC

## 2015-10-07 NOTE — Assessment & Plan Note (Signed)
   Treatment plan as outlined above.  A prescription has been provided for Pazeo, one drop per eye daily as needed. 

## 2015-10-07 NOTE — Assessment & Plan Note (Addendum)
Today's spirometry results, assessed while asymptomatic, suggest under-perception of dyspnea.  A prescription has been provided for montelukast 10 mg daily at bedtime.  During respiratory tract infections and asthma flares, the patient may add Qvar 40 g, 2 inhalations via spacer device twice a day until symptoms have returned to baseline.  A prescription for Qvar 40 g has been provided.  A prescription has been provided for ProAir Respiclick, 1-2 inhalations every 4-6 hours as needed.  Subjective and objective measures of pulmonary function will be followed and the treatment plan will be adjusted accordingly.

## 2015-10-07 NOTE — Patient Instructions (Addendum)
Perennial and seasonal allergic rhinitis  Aeroallergen avoidance measures have been discussed and provided in written form.  A prescription has been provided for levocetirizine, 5 mg daily as needed.  A prescription has been provided for fluticasone nasal spray, 2 sprays per nostril daily as needed. Proper nasal spray technique has been discussed and demonstrated.  The risks and benefits of aeroallergen immunotherapy have been discussed. The patient is motivated to initiate immunotherapy if insurance coverage is favorable. She will let us know how she would like to proceed.  Seasonal allergic conjunctivitis  Treatment plan as outlined above.  A prescription has been provided for Pazeo, one drop per eye daily as needed.  Mild persistent asthma Today's spirometry results, assessed while asymptomatic, suggest under-perception of dyspnea.  A prescription has been provided for montelukast 10 mg daily at bedtime.  During respiratory tract infections and asthma flares, the patient may add Qvar 40 g, 2 inhalations via spacer device twice a day until symptoms have returned to baseline.  A prescription for Qvar 40 g has been provided.  A prescription has been provided for ProAir Respiclick, 1-2 inhalations every 4-6 hours as needed.  Subjective and objective measures of pulmonary function will be followed and the treatment plan will be adjusted accordingly.    Return in about 2 months (around 12/07/2015).  Reducing Pollen Exposure  The American Academy of Allergy, Asthma and Immunology suggests the following steps to reduce your exposure to pollen during allergy seasons.    1. Do not hang sheets or clothing out to dry; pollen may collect on these items. 2. Do not mow lawns or spend time around freshly cut grass; mowing stirs up pollen. 3. Keep windows closed at night.  Keep car windows closed while driving. 4. Minimize morning activities outdoors, a time when pollen counts are usually at  their highest. 5. Stay indoors as much as possible when pollen counts or humidity is high and on windy days when pollen tends to remain in the air longer. 6. Use air conditioning when possible.  Many air conditioners have filters that trap the pollen spores. 7. Use a HEPA room air filter to remove pollen form the indoor air you breathe.   Control of House Dust Mite Allergen  House dust mites play a major role in allergic asthma and rhinitis.  They occur in environments with high humidity wherever human skin, the food for dust mites is found. High levels have been detected in dust obtained from mattresses, pillows, carpets, upholstered furniture, bed covers, clothes and soft toys.  The principal allergen of the house dust mite is found in its feces.  A gram of dust may contain 1,000 mites and 250,000 fecal particles.  Mite antigen is easily measured in the air during house cleaning activities.    1. Encase mattresses, including the box spring, and pillow, in an air tight cover.  Seal the zipper end of the encased mattresses with wide adhesive tape. 2. Wash the bedding in water of 130 degrees Farenheit weekly.  Avoid cotton comforters/quilts and flannel bedding: the most ideal bed covering is the dacron comforter. 3. Remove all upholstered furniture from the bedroom. 4. Remove carpets, carpet padding, rugs, and non-washable window drapes from the bedroom.  Wash drapes weekly or use plastic window coverings. 5. Remove all non-washable stuffed toys from the bedroom.  Wash stuffed toys weekly. 6. Have the room cleaned frequently with a vacuum cleaner and a damp dust-mop.  The patient should not be in a room which  is being cleaned and should wait 1 hour after cleaning before going into the room. 7. Close and seal all heating outlets in the bedroom.  Otherwise, the room will become filled with dust-laden air.  An electric heater can be used to heat the room. Reduce indoor humidity to less than 50%.  Do  not use a humidifier.  Control of Dog or Cat Allergen  Avoidance is the best way to manage a dog or cat allergy. If you have a dog or cat and are allergic to dog or cats, consider removing the dog or cat from the home. If you have a dog or cat but don't want to find it a new home, or if your family wants a pet even though someone in the household is allergic, here are some strategies that may help keep symptoms at bay:  1. Keep the pet out of your bedroom and restrict it to only a few rooms. Be advised that keeping the dog or cat in only one room will not limit the allergens to that room. 2. Don't pet, hug or kiss the dog or cat; if you do, wash your hands with soap and water. 3. High-efficiency particulate air (HEPA) cleaners run continuously in a bedroom or living room can reduce allergen levels over time. 4. Regular use of a high-efficiency vacuum cleaner or a central vacuum can reduce allergen levels. 5. Giving your dog or cat a bath at least once a week can reduce airborne allergen.  Control of Mold Allergen  Mold and fungi can grow on a variety of surfaces provided certain temperature and moisture conditions exist.  Outdoor molds grow on plants, decaying vegetation and soil.  The major outdoor mold, Alternaria and Cladosporium, are found in very high numbers during hot and dry conditions.  Generally, a late Summer - Fall peak is seen for common outdoor fungal spores.  Rain will temporarily lower outdoor mold spore count, but counts rise rapidly when the rainy period ends.  The most important indoor molds are Aspergillus and Penicillium.  Dark, humid and poorly ventilated basements are ideal sites for mold growth.  The next most common sites of mold growth are the bathroom and the kitchen.  Outdoor [employer] 4. Use air conditioning and keep windows closed 5. Avoid exposure to decaying vegetation. 6. Avoid leaf raking. 7. Avoid grain handling. 8. Consider wearing a face mask if working  in moldy areas.  Indoor Mold Control 1. Maintain humidity below 50%. 2. Clean washable surfaces with 5% bleach solution. 3. Remove sources e.g. Contaminated carpets.  Control of Cockroach Allergen  Cockroach allergen has been identified as an important cause of acute attacks of asthma, especially in urban settings.  There are fifty-five species of cockroach that exist in the [place], however only three, the [place], [place] species produce allergen that can affect patients with Asthma.  Allergens can be obtained from fecal particles, egg casings and secretions from cockroaches.    1. Remove food sources. 2. Reduce access to water. 3. Seal access and entry points. 4. Spray runways with 0.5-1% Diazinon or Chlorpyrifos 5. Blow boric acid power under stoves and refrigerator. 6. Place bait stations (hydramethylnon) at feeding sites.

## 2015-10-07 NOTE — Assessment & Plan Note (Signed)
   Aeroallergen avoidance measures have been discussed and provided in written form.  A prescription has been provided for levocetirizine, 5 mg daily as needed.  A prescription has been provided for fluticasone nasal spray, 2 sprays per nostril daily as needed. Proper nasal spray technique has been discussed and demonstrated.  The risks and benefits of aeroallergen immunotherapy have been discussed. The patient is motivated to initiate immunotherapy if insurance coverage is favorable. She will let us know how she would like to proceed. 

## 2015-10-07 NOTE — Progress Notes (Signed)
New Patient Note  RE: Marilyn Wiley MRN: [id number] DOB: [birth year] Date of Office Visit: 10/07/2015  Referring provider: Milus Height, PA-C Primary care provider: REDMON,NOELLE, PA-C  Chief Complaint: Asthma and Allergic Rhinitis    History of present illness: HPI Comments: Marilyn Wiley is a 25 y.o. female presenting today for consultation of asthma and rhinitis.  She was diagnosed with asthma as a child.  Her asthma symptoms consist of coughing, dyspnea, chest tightness, and wheezing.  Her asthma symptoms are triggered by upper respiratory tract infections, exercise, as well as exposure to dust, cats, and pollens.  She currently experiences asthma symptoms one or 2 times per week on average and denies nocturnal awakenings due to lower respiratory symptoms.  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  Her most recent trip to the emergency department for an asthma exacerbation was in July 2015.  Marilyn Wiley experiences nasal congestion, rhinorrhea, sneezing, nasal pruritus, pharyngeal pruritus, ear canal pruritus, and ocular pruritus.  The symptoms occur year around but her more severe in the springtime and in the fall.   Assessment and plan: Perennial and seasonal allergic rhinitis  Aeroallergen avoidance measures have been discussed and provided in written form.  A prescription has been provided for levocetirizine, 5 mg daily as needed.  A prescription has been provided for fluticasone nasal spray, 2 sprays per nostril daily as needed. Proper nasal spray technique has been discussed and demonstrated.  The risks and benefits of aeroallergen immunotherapy have been discussed. The patient is motivated to initiate immunotherapy if insurance coverage is favorable. She will let us know how she would like to proceed.  Seasonal allergic conjunctivitis  Treatment plan as outlined above.  A  prescription has been provided for Pazeo, one drop per eye daily as needed.  Mild persistent asthma Today's spirometry results, assessed while asymptomatic, suggest under-perception of dyspnea.  A prescription has been provided for montelukast 10 mg daily at bedtime.  During respiratory tract infections and asthma flares, the patient may add Qvar 40 g, 2 inhalations via spacer device twice a day until symptoms have returned to baseline.  A prescription for Qvar 40 g has been provided.  A prescription has been provided for ProAir Respiclick, 1-2 inhalations every 4-6 hours as needed.  Subjective and objective measures of pulmonary function will be followed and the treatment plan will be adjusted accordingly.    Meds ordered this encounter  Medications  . levocetirizine (XYZAL) 5 MG tablet    Sig: Take 1 tablet (5 mg total) by mouth daily.    Dispense:  30 tablet    Refill:  5  . fluticasone (FLONASE) 50 MCG/ACT nasal spray    Sig: Place 2 sprays into both nostrils daily.    Dispense:  16 g    Refill:  5  . Olopatadine HCl (PAZEO) 0.7 % SOLN    Sig: Place 1 drop into both eyes 1 day or 1 dose.    Dispense:  1 Bottle    Refill:  5  . montelukast (SINGULAIR) 10 MG tablet    Sig: Take 1 tablet (10 mg total) by mouth at bedtime.    Dispense:  30 tablet    Refill:  5  . beclomethasone (QVAR) 40 MCG/ACT inhaler    Sig: Inhale 2 puffs into the lungs 2 (two) times daily.    Dispense:  1 Inhaler    Refill:  5  .  Albuterol Sulfate (PROAIR RESPICLICK) 108 (90 Base) MCG/ACT AEPB    Sig: Inhale 2 puffs into the lungs every 4 (four) hours as needed.    Dispense:  1 each    Refill:  1    Diagnositics: Spirometry: Spirometry reveals an FVC of 3.75 L and an FEV1 of 2.89 L with significant (320 mL) post bronchodilator improvement. Epicutaneous testing: Positive to grass pollens, tree pollens, cat hair, and dust mite antigen. Intradermal testing: Positive to ragweed pollen, weed pollen,  molds, dog epithelia, and cockroach antigen.    Physical examination: Blood pressure 110/68, pulse 80, temperature 98.4 F (36.9 C), temperature source Oral, resp. rate 18, height  (1.626 m), weight 188 lb 0.8 oz (85.3 kg).  General: Alert, interactive, in no acute distress. HEENT: TMs pearly gray, turbinates edematous and pale without discharge, post-pharynx moderately erythematous. Neck: Supple without lymphadenopathy. Lungs: Clear to auscultation without wheezing, rhonchi or rales. CV: Normal S1, S2 without murmurs. Abdomen: Nondistended, nontender. Skin: Warm and dry, without lesions or rashes. Extremities:  No clubbing, cyanosis or edema. Neuro:   Grossly intact.  Review of systems:  Review of Systems  Constitutional: Negative for fever, chills and weight loss.  HENT: Positive for congestion. Negative for nosebleeds.   Eyes: Negative for blurred vision.  Respiratory: Positive for cough, shortness of breath and wheezing. Negative for hemoptysis.   Cardiovascular: Negative for chest pain.  Gastrointestinal: Negative for diarrhea and constipation.  Genitourinary: Negative for dysuria.  Musculoskeletal: Negative for myalgias and joint pain.  Skin: Positive for itching.  Neurological: Negative for dizziness.  Endo/Heme/Allergies: Positive for environmental allergies. Does not bruise/bleed easily.    Past medical history:  Past Medical History  Diagnosis Date  . Asthma   . Hypertension   . Anemia     Past surgical history:  History reviewed. No pertinent past surgical history.  Family history: Family History  Problem Relation Age of Onset  . Diabetes Mother   . Asthma Mother   . Diabetes Other   . Cancer Other   . Asthma Brother     Social history: Social History   Social History  . Marital Status: Single    Spouse Name: N/A  . Number of Children: N/A  . Years of Education: N/A   Occupational History  . Not on file.   Social History Main Topics  .  Smoking status: Former Smoker    Types: Cigarettes  . Smokeless tobacco: Not on file     Comment: hooka smoking  . Alcohol Use: 0.0 oz/week    0 Standard drinks or equivalent per week     Comment: social   . Drug Use: No  . Sexual Activity: Not on file   Other Topics Concern  . Not on file   Social History Narrative   Environmental History: The patient lives in a house with carpeting throughout, gas heat, and central air.  She is a nonsmoker without pets.    Medication List       This list is accurate as of: 10/07/15  4:04 PM.  Always use your most recent med list.               albuterol 108 (90 Base) MCG/ACT inhaler  Commonly known as:  PROVENTIL HFA;VENTOLIN HFA  Inhale 1 puff into the lungs every 6 (six) hours as needed. For shortness of breath     Albuterol Sulfate 108 (90 Base) MCG/ACT Aepb  Commonly known as:  PROAIR RESPICLICK  Inhale 2  puffs into the lungs every 4 (four) hours as needed.     beclomethasone 40 MCG/ACT inhaler  Commonly known as:  QVAR  Inhale 2 puffs into the lungs 2 (two) times daily.     benzonatate 100 MG capsule  Commonly known as:  TESSALON  Take 1 capsule (100 mg total) by mouth every 8 (eight) hours.     fluticasone 50 MCG/ACT nasal spray  Commonly known as:  FLONASE  Place 2 sprays into both nostrils daily.     HYDROcodone-acetaminophen 5-325 MG tablet  Commonly known as:  NORCO/VICODIN  Take 2 tablets by mouth every 4 (four) hours as needed.     ibuprofen 800 MG tablet  Commonly known as:  ADVIL,MOTRIN  Take 1 tablet (800 mg total) by mouth 3 (three) times daily.     levocetirizine 5 MG tablet  Commonly known as:  XYZAL  Take 1 tablet (5 mg total) by mouth daily.     montelukast 10 MG tablet  Commonly known as:  SINGULAIR  Take 1 tablet (10 mg total) by mouth at bedtime.     norethindrone-ethinyl estradiol 0.4-35 MG-MCG tablet  Commonly known as:  OVCON-35,BALZIVA,BRIELLYN  Take 1 tablet by mouth daily. Reported on  10/07/2015     Olopatadine HCl 0.7 % Soln  Commonly known as:  PAZEO  Place 1 drop into both eyes 1 day or 1 dose.     OVER THE COUNTER MEDICATION  Take 2 capsules by mouth daily. Reported on 10/07/2015        Known medication allergies: No Known Allergies  I appreciate the opportunity to take part in this Rhema's care. Please do not hesitate to contact me with questions.  Sincerely,   R. Jorene Guestarter Trejon Duford, MD

## 2016-05-16 IMAGING — CR DG KNEE COMPLETE 4+V*L*
4 series · 4 of 4 positions shown · non-contrast
Comparison: None.

CLINICAL DATA: Left knee pain following jumping jacks, initial
encounter

EXAM:
LEFT KNEE - COMPLETE 4+ VIEW

[t knee obl left (1 of 2)]
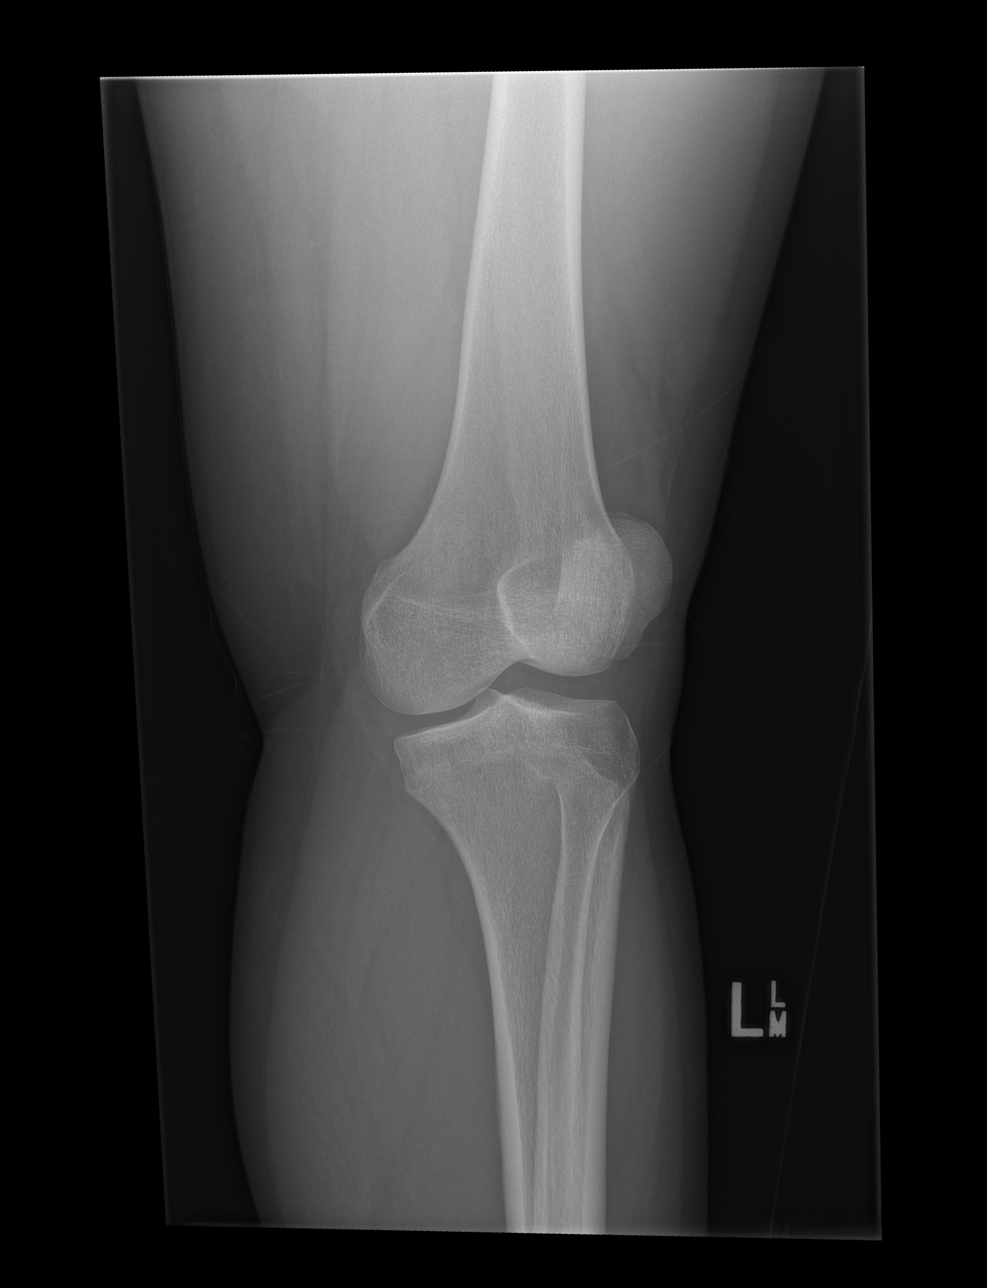

[t knee ap left]
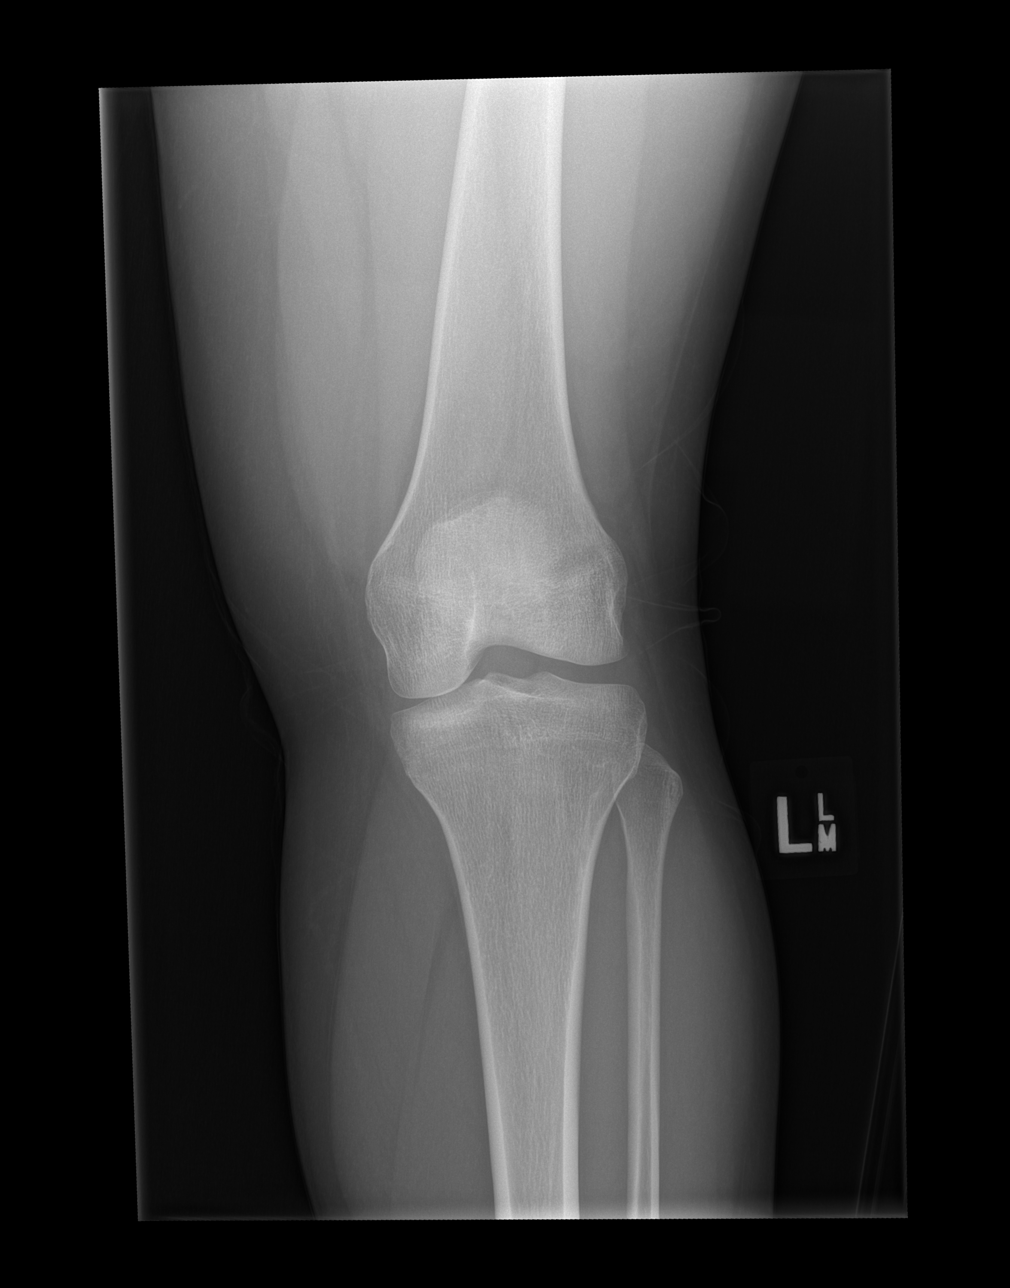

[t knee obl left (2 of 2)]
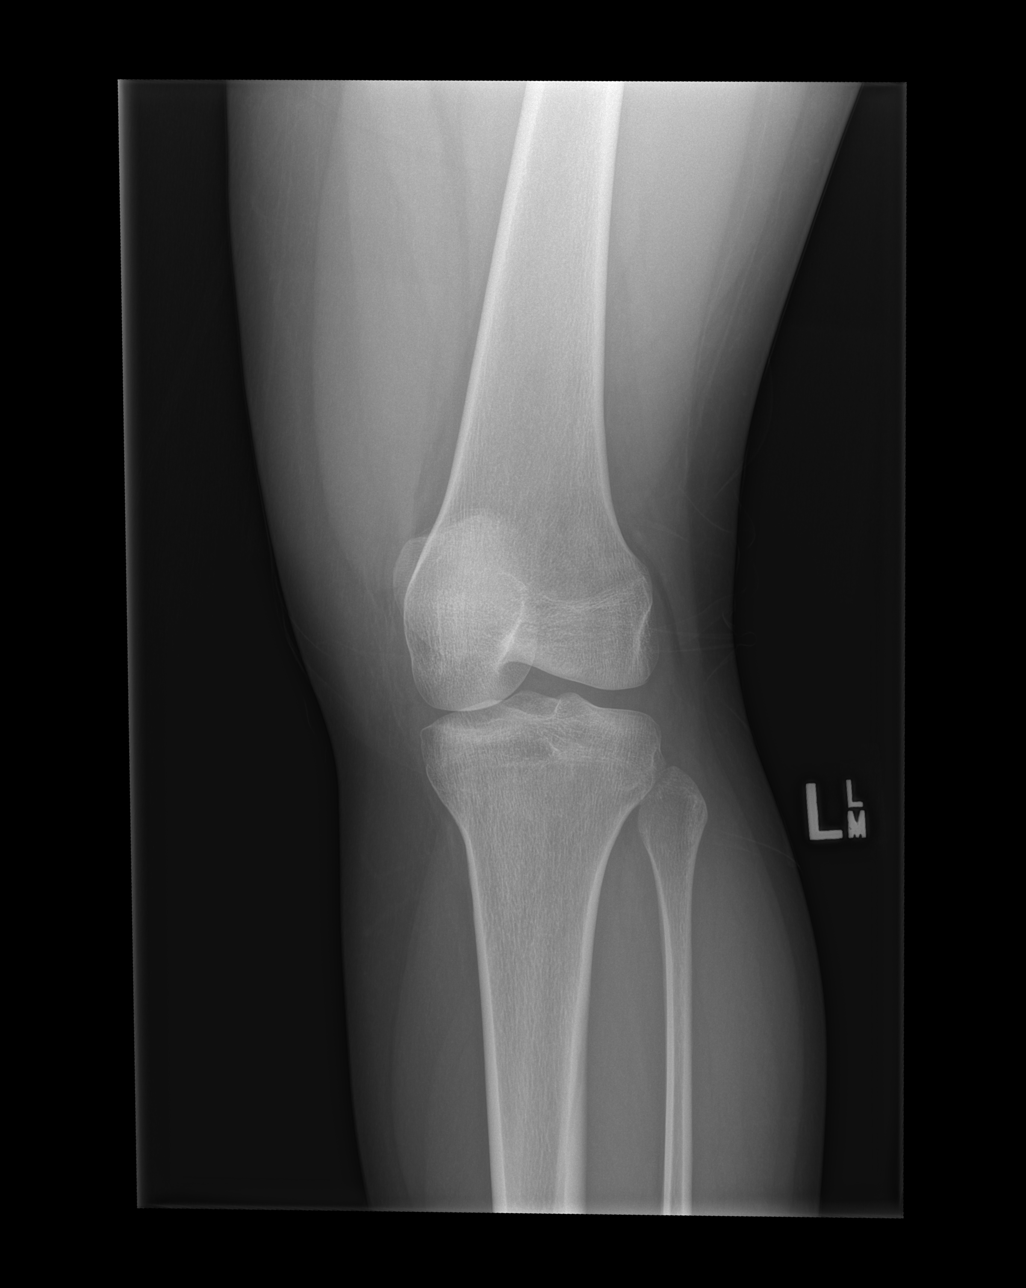

[t knee lat left]
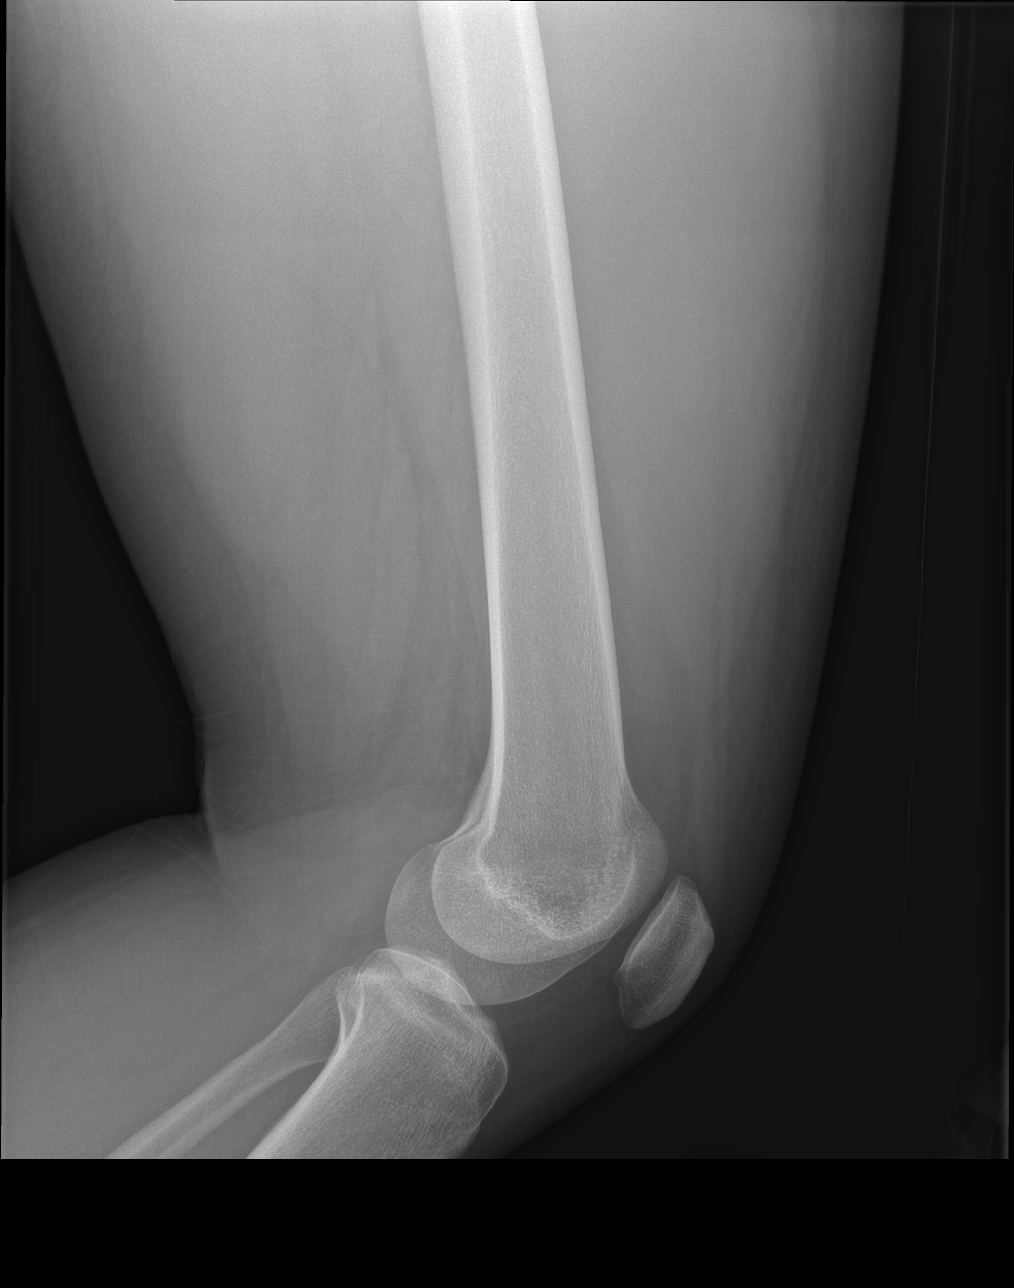

[4 of 4 positions shown; findings below may reference images not displayed]

FINDINGS: There is no evidence of fracture, dislocation, or joint effusion.
There is no evidence of arthropathy or other focal bone abnormality.
Soft tissues are unremarkable.
IMPRESSION: No acute abnormality noted.

## 2016-05-22 ENCOUNTER — Other Ambulatory Visit: Payer: Self-pay | Admitting: Allergy and Immunology

## 2016-05-22 DIAGNOSIS — J4530 Mild persistent asthma, uncomplicated: Secondary | ICD-10-CM

## 2016-05-22 DIAGNOSIS — J3089 Other allergic rhinitis: Secondary | ICD-10-CM

## 2016-05-22 DIAGNOSIS — H1010 Acute atopic conjunctivitis, unspecified eye: Secondary | ICD-10-CM

## 2016-06-19 ENCOUNTER — Other Ambulatory Visit: Payer: Self-pay | Admitting: *Deleted

## 2016-06-19 DIAGNOSIS — J3089 Other allergic rhinitis: Secondary | ICD-10-CM

## 2016-06-19 DIAGNOSIS — H1010 Acute atopic conjunctivitis, unspecified eye: Secondary | ICD-10-CM

## 2016-08-11 ENCOUNTER — Encounter (HOSPITAL_COMMUNITY): Payer: Self-pay | Admitting: *Deleted

## 2016-08-11 ENCOUNTER — Ambulatory Visit (HOSPITAL_COMMUNITY)
Admission: EM | Admit: 2016-08-11 | Discharge: 2016-08-11 | Disposition: A | Discharge status: Home / Self Care | Payer: 59 | Attending: Family Medicine | Admitting: Family Medicine

## 2016-08-11 DIAGNOSIS — R21 Rash and other nonspecific skin eruption: Secondary | ICD-10-CM

## 2016-08-11 MED ORDER — CLOTRIMAZOLE-BETAMETHASONE 1-0.05 % EX CREA: TOPICAL_CREAM | CUTANEOUS | 1 refills | Status: DC

## 2016-08-11 NOTE — Discharge Instructions (Signed)
Apply the Lotrisone cream to the affected area twice a day and take an OTC antihistamine daily such as Claritin, Zyrtec, or Xyzal. Should your symptoms persist follow up with your primary care provider or a dermatologist as needed.

## 2016-08-11 NOTE — ED Triage Notes (Signed)
Pt  Reports  A  Rash  On  Upper  Torso  That  She  Has  Had  For several  Weeks   She  Reports  The  Rash itches   She   Appears in  No  Severe  Acute distress   No  Angioedema

## 2016-08-11 NOTE — ED Provider Notes (Signed)
CSN: [id number]     Arrival date & time 08/11/16  1437 History   None    Chief Complaint  Patient presents with  . Rash   (Consider location/radiation/quality/duration/timing/severity/associated sxs/prior Treatment) 26 year old female presents to clinic with a one-month history of a rash under her right breast, and under her left breast. She states is been quite itchy, and uncomfortable, however there is no pain, burning, or other discomfort. She denies any new soap, detergents, lotions, hygiene products, close, or other sources of possible allergy. She has no systemic symptoms, and denies any other complaint.   The history is provided by the patient.  Rash    Past Medical History:  Diagnosis Date  . Anemia   . Asthma   . Hypertension    History reviewed. No pertinent surgical history. Family History  Problem Relation Age of Onset  . Diabetes Mother   . Asthma Mother   . Diabetes Other   . Cancer Other   . Asthma Brother    Social History  Substance Use Topics  . Smoking status: Former Smoker    Types: Cigarettes  . Smokeless tobacco: Not on file     Comment: hooka smoking  . Alcohol use 0.0 oz/week     Comment: social    OB History    No data available     Review of Systems  Reason unable to perform ROS: As covered in history of present illness.  Skin: Positive for rash.  All other systems reviewed and are negative.   Allergies  Patient has no known allergies.  Home Medications   Prior to Admission medications   Medication Sig Start Date End Date Taking? Authorizing Provider  albuterol (PROVENTIL HFA;VENTOLIN HFA) 108 (90 BASE) MCG/ACT inhaler Inhale 1 puff into the lungs every 6 (six) hours as needed. For shortness of breath 11/26/13   Elson AreasLeslie K Sofia, PA-C  Albuterol Sulfate (PROAIR RESPICLICK) 108 (90 Base) MCG/ACT AEPB Inhale 2 puffs into the lungs every 4 (four) hours as needed. 10/07/15   Cristal Fordalph Carter Bobbitt, MD  beclomethasone (QVAR) 40 MCG/ACT inhaler  Inhale 2 puffs into the lungs 2 (two) times daily. 10/07/15   Cristal Fordalph Carter Bobbitt, MD  clotrimazole-betamethasone (LOTRISONE) cream Apply to affected area 2 times daily prn 08/11/16   Dorena BodoLawrence Ellee Wawrzyniak, NP  fluticasone [facility](FLONASE) 50 MCG/ACT nasal spray Place 2 sprays into both nostrils daily. 10/07/15   Cristal Fordalph Carter Bobbitt, MD  levocetirizine (XYZAL) 5 MG tablet Take 1 tablet (5 mg total) by mouth daily. 10/07/15   Cristal Fordalph Carter Bobbitt, MD  montelukast (SINGULAIR) 10 MG tablet Take 1 tablet (10 mg total) by mouth at bedtime. 10/07/15   Cristal Fordalph Carter Bobbitt, MD  norethindrone-ethinyl estradiol Brooks Sailors(OVCON-35,BALZIVA,BRIELLYN) 0.4-35 MG-MCG tablet Take 1 tablet by mouth daily. Reported on 10/07/2015    Historical Provider, MD  Olopatadine HCl (PAZEO) 0.7 % SOLN Place 1 drop into both eyes 1 day or 1 dose. 10/07/15   Cristal Fordalph Carter Bobbitt, MD  OVER THE COUNTER MEDICATION Take 2 capsules by mouth daily. Reported on 10/07/2015    Historical Provider, MD   Meds Ordered and Administered this Visit  Medications - No data to display  BP 110/72 (BP Location: Right Arm)   Pulse 80   Temp 98.6 F (37 C) (Oral)   Resp 18   SpO2 100%  No data found.   Physical Exam  Constitutional: She is oriented to person, place, and time. She appears well-developed and well-nourished. No distress.  HENT:  Head: Normocephalic and  atraumatic.  Cardiovascular: Normal rate and regular rhythm.   Pulmonary/Chest: Effort normal and breath sounds normal.  Neurological: She is alert and oriented to person, place, and time.  Skin: Skin is warm and dry. Capillary refill takes less than 2 seconds. Rash noted. No abrasion, no bruising, no burn, no lesion and no purpura noted. Rash is maculopapular. Rash is not vesicular. She is not diaphoretic. There is erythema. No pallor.  3 cm, erythemic, maculopapular lesion under the right breast. Half centimeter by half centimeter lesion under the left breast of similar description.  Psychiatric: She  has a normal mood and affect.  Nursing note and vitals reviewed.   Urgent Care Course     Procedures (including critical care time)  Labs Review Labs Reviewed - No data to display  Imaging Review No results found.    MDM   1. Rash    Apply the Lotrisone cream to the affected area twice a day and take an OTC antihistamine daily such as Claritin, Zyrtec, or Xyzal. Should your symptoms persist follow up with your primary care provider or a dermatologist as needed.     Dorena Bodo, NP 08/11/16 1527

## 2016-08-21 ENCOUNTER — Ambulatory Visit (INDEPENDENT_AMBULATORY_CARE_PROVIDER_SITE_OTHER): Payer: 59 | Admitting: Allergy and Immunology

## 2016-08-21 VITALS — BP 108/64 | HR 100 | Resp 14 | Ht 64.0 in | Wt 199.0 lb

## 2016-08-21 DIAGNOSIS — J3089 Other allergic rhinitis: Secondary | ICD-10-CM

## 2016-08-21 DIAGNOSIS — H1045 Other chronic allergic conjunctivitis: Secondary | ICD-10-CM | POA: Diagnosis not present

## 2016-08-21 DIAGNOSIS — H1010 Acute atopic conjunctivitis, unspecified eye: Secondary | ICD-10-CM

## 2016-08-21 DIAGNOSIS — J4530 Mild persistent asthma, uncomplicated: Secondary | ICD-10-CM | POA: Diagnosis not present

## 2016-08-21 MED ORDER — FLUTICASONE PROPIONATE 50 MCG/ACT NA SUSP: 2.0000 | Freq: Every day | NASAL | 5 refills | Status: AC

## 2016-08-21 MED ORDER — LEVOCETIRIZINE DIHYDROCHLORIDE 5 MG PO TABS: 5.0000 mg | ORAL_TABLET | Freq: Every day | ORAL | 5 refills | Status: DC

## 2016-08-21 MED ORDER — MONTELUKAST SODIUM 10 MG PO TABS: 10.0000 mg | ORAL_TABLET | Freq: Every day | ORAL | 5 refills | Status: DC

## 2016-08-21 NOTE — Assessment & Plan Note (Signed)
   Continue appropriate allergen avoidance measures.  Refill prescriptions have been provided for levocetirizine 5 g daily as needed and fluticasone nasal spray, 1-2 sprays per nostril daily as needed.

## 2016-08-21 NOTE — Progress Notes (Signed)
Follow-up Note  RE: Marilyn Wiley MRN: [id number] DOB: November 20, 1990 Date of Office Visit: 08/21/2016  Primary care provider: REDMON,NOELLE, PA-C Referring provider: Milus Height, PA-C  History of present illness: Marilyn Wiley is a 26 y.o. female with persistent asthma and allergic rhinoconjunctivitis presenting today for follow up.  She was last seen in this clinic in April 2017.  She reports that she has run out of all of her medications, including montelukast, levocetirizine, and Qvar.  She believes that she ran out of these medications in the fall of 2017.  Despite being off of these medications, she rarely requires albuterol rescue and denies nocturnal awakenings due to lower respiratory symptoms.   Assessment and plan: Mild persistent asthma Today's spirometry results, assessed while asymptomatic, suggest under-perception of bronchoconstriction.   Restart montelukast 10 mg daily at bedtime.  A refill prescription has been provided.  Continue albuterol HFA, 1-2 inhalations every 4-6 hours as needed.  Subjective and objective measures of pulmonary function will be followed and the treatment plan will be adjusted accordingly.  Perennial and seasonal allergic rhinitis  Continue appropriate allergen avoidance measures.  Refill prescriptions have been provided for levocetirizine 5 g daily as needed and fluticasone nasal spray, 1-2 sprays per nostril daily as needed.   Meds ordered this encounter  Medications  . montelukast (SINGULAIR) 10 MG tablet    Sig: Take 1 tablet (10 mg total) by mouth at bedtime.    Dispense:  30 tablet    Refill:  5  . levocetirizine (XYZAL) 5 MG tablet    Sig: Take 1 tablet (5 mg total) by mouth daily.    Dispense:  30 tablet    Refill:  5  . fluticasone (FLONASE) 50 MCG/ACT nasal spray    Sig: Place 2 sprays into both nostrils daily.    Dispense:  16 g    Refill:  5    Diagnostics: Spirometry reveals an FVC of 2.98 L and an FEV1 of 2.26  L (84% predicted) with 240 mL postbronchodilator improvement.This study was performed while the patient was asymptomatic.  Please see scanned spirometry results for details.    Physical examination: Blood pressure 108/64, pulse 100, resp. rate 14, height 5\' 4"  (1.626 m), weight 199 lb (90.3 kg), SpO2 98 %.  General: Alert, interactive, in no acute distress. HEENT: TMs pearly gray, turbinates mildly edematous without discharge, post-pharynx unremarkable. Neck: Supple without lymphadenopathy. Lungs: Clear to auscultation without wheezing, rhonchi or rales. CV: Normal S1, S2 without murmurs. Skin: Warm and dry, without lesions or rashes.  The following portions of the patient's history were reviewed and updated as appropriate: allergies, current medications, past family history, past medical history, past social history, past surgical history and problem list.  Allergies as of 08/21/2016   No Known Allergies     Medication List       Accurate as of 08/21/16 12:11 PM. Always use your most recent med list.          ACZONE 7.5 % Gel Generic drug:  Dapsone 1 APPLICATION TO AFFECTED AREA ONCE A DAY BEDTIME EXTERNALLY 30 DAYS   Albuterol Sulfate 108 (90 Base) MCG/ACT Aepb Commonly known as:  PROAIR RESPICLICK Inhale 2 puffs into the lungs every 4 (four) hours as needed.   beclomethasone 40 MCG/ACT inhaler Commonly known as:  QVAR Inhale 2 puffs into the lungs 2 (two) times daily.   fluticasone 50 MCG/ACT nasal spray Commonly known as:  FLONASE Place 2 sprays into both nostrils daily.  levocetirizine 5 MG tablet Commonly known as:  XYZAL Take 1 tablet (5 mg total) by mouth daily.   montelukast 10 MG tablet Commonly known as:  SINGULAIR Take 1 tablet (10 mg total) by mouth at bedtime.   norethindrone-ethinyl estradiol 0.4-35 MG-MCG tablet Commonly known as:  OVCON-35,BALZIVA,BRIELLYN Take 1 tablet by mouth daily. Reported on 10/07/2015   Olopatadine HCl 0.7 % Soln Commonly  known as:  PAZEO Place 1 drop into both eyes 1 day or 1 dose.       No Known Allergies  I appreciate the opportunity to take part in Darrien's care. Please do not hesitate to contact me with questions.  Sincerely,   R. Jorene Guestarter Keaundre Thelin, MD

## 2016-08-21 NOTE — Assessment & Plan Note (Signed)
Today's spirometry results, assessed while asymptomatic, suggest under-perception of bronchoconstriction.   Restart montelukast 10 mg daily at bedtime.  A refill prescription has been provided.  Continue albuterol HFA, 1-2 inhalations every 4-6 hours as needed.  Subjective and objective measures of pulmonary function will be followed and the treatment plan will be adjusted accordingly.

## 2016-08-21 NOTE — Patient Instructions (Addendum)
Mild persistent asthma Today's spirometry results, assessed while asymptomatic, suggest under-perception of bronchoconstriction.   Restart montelukast 10 mg daily at bedtime.  A refill prescription has been provided.  Continue albuterol HFA, 1-2 inhalations every 4-6 hours as needed.  Subjective and objective measures of pulmonary function will be followed and the treatment plan will be adjusted accordingly.  Perennial and seasonal allergic rhinitis  Continue appropriate allergen avoidance measures.  Refill prescriptions have been provided for levocetirizine 5 g daily as needed and fluticasone nasal spray, 1-2 sprays per nostril daily as needed.   Return in about 6 months (around 02/21/2017), or if symptoms worsen or fail to improve.

## 2016-11-01 ENCOUNTER — Other Ambulatory Visit: Payer: Self-pay | Admitting: Physician Assistant

## 2016-11-01 ENCOUNTER — Other Ambulatory Visit (HOSPITAL_COMMUNITY)
Admission: RE | Admit: 2016-11-01 | Discharge: 2016-11-01 | Disposition: A | Discharge status: Home / Self Care | Payer: 59 | Source: Ambulatory Visit | Attending: Physician Assistant | Admitting: Physician Assistant

## 2016-11-01 DIAGNOSIS — Z124 Encounter for screening for malignant neoplasm of cervix: Secondary | ICD-10-CM | POA: Insufficient documentation

## 2016-11-08 LAB — CYTOLOGY - PAP
Diagnosis: UNDETERMINED — AB
HPV (WINDOPATH): DETECTED — AB

## 2016-12-29 ENCOUNTER — Encounter (HOSPITAL_COMMUNITY): Payer: Self-pay | Admitting: Emergency Medicine

## 2016-12-29 ENCOUNTER — Ambulatory Visit (HOSPITAL_COMMUNITY)
Admission: EM | Admit: 2016-12-29 | Discharge: 2016-12-29 | Disposition: A | Discharge status: Home / Self Care | Payer: 59 | Attending: Family | Admitting: Family

## 2016-12-29 DIAGNOSIS — N939 Abnormal uterine and vaginal bleeding, unspecified: Secondary | ICD-10-CM

## 2016-12-29 LAB — POCT I-STAT, CHEM 8
BUN: 12 mg/dL (ref 6–20)
Calcium, Ion: 1.35 mmol/L (ref 1.15–1.40)
Chloride: 102 mmol/L (ref 101–111)
Creatinine, Ser: 0.9 mg/dL (ref 0.44–1.00)
Glucose, Bld: 83 mg/dL (ref 65–99)
HEMATOCRIT: 39 % (ref 36.0–46.0)
HEMOGLOBIN: 13.3 g/dL (ref 12.0–15.0)
POTASSIUM: 3.9 mmol/L (ref 3.5–5.1)
Sodium: 139 mmol/L (ref 135–145)
TCO2: 28 mmol/L (ref 0–100)

## 2016-12-29 LAB — POCT PREGNANCY, URINE: PREG TEST UR: NEGATIVE

## 2016-12-29 NOTE — Discharge Instructions (Signed)
Start over the counter iron supplements.  [facility] as you may need further testing, imaging to understand why bleeding and/or Nexplanon removed.

## 2016-12-29 NOTE — ED Provider Notes (Signed)
CSN: [id number]     Arrival date & time 12/29/16  1717 History   None    Chief Complaint  Patient presents with  . Vaginal Bleeding   (Consider location/radiation/quality/duration/timing/severity/associated sxs/prior Treatment) Chief complaint of vaginal bleeding x 6, unchanged. Notes 'spotting' the week prior. Notes dime size clots.  Patient describes her menstrual cycle 'heavier than normal.' She also reports fatigue, dizziness. No syncope.  Describes changing pad every hour and half. Mild cramping, low back pain which she states has improved.   No fever, N, vomiting.  Nexplanon 1.5 years and didn't have cycles for year, then started having periods again which were getting heavier, spotting.Started nexplanon for painful cycles, convenience, and to prevent pregnancy.  Has also Taken Oral Birth Control Pills As Well for 1.5 months to help with cycles/spotting with no improvement. She Stopped Taking Birth Control pills This past Sunday Because she started Her Menstrual Period  Former Smoker  No concern for pregnancy, stds. No new partners.       Past Medical History:  Diagnosis Date  . Anemia   . Asthma   . Hypertension    History reviewed. No pertinent surgical history. Family History  Problem Relation Age of Onset  . Diabetes Mother   . Asthma Mother   . Diabetes Other   . Cancer Other   . Asthma Brother    Social History  Substance Use Topics  . Smoking status: Former Smoker    Types: Cigarettes  . Smokeless tobacco: Not on file     Comment: hooka smoking  . Alcohol use 0.0 oz/week     Comment: social    OB History    No data available     Review of Systems  Constitutional: Negative for chills and fever.  Respiratory: Negative for cough.   Cardiovascular: Negative for chest pain and palpitations.  Gastrointestinal: Negative for abdominal distention, abdominal pain, nausea and vomiting.  Genitourinary: Positive for vaginal bleeding. Negative for dysuria,  hematuria, pelvic pain, vaginal discharge and vaginal pain.    Allergies  Patient has no known allergies.  Home Medications   Prior to Admission medications   Medication Sig Start Date End Date Taking? Authorizing Provider  ACZONE 7.5 % GEL 1 APPLICATION TO AFFECTED AREA ONCE A DAY BEDTIME EXTERNALLY 30 DAYS 08/16/16  Yes [provider]  Albuterol Sulfate (PROAIR RESPICLICK) 108 (90 Base) MCG/ACT AEPB Inhale 2 puffs into the lungs every 4 (four) hours as needed. 10/07/15  Yes Bobbitt, Heywood Iles, MD  levocetirizine (XYZAL) 5 MG tablet Take 1 tablet (5 mg total) by mouth daily. 08/21/16  Yes Bobbitt, Heywood Iles, MD  beclomethasone (QVAR) 40 MCG/ACT inhaler Inhale 2 puffs into the lungs 2 (two) times daily. 10/07/15   Bobbitt, Heywood Iles, MD  fluticasone (FLONASE) 50 MCG/ACT nasal spray Place 2 sprays into both nostrils daily. 08/21/16   Bobbitt, Heywood Iles, MD  montelukast (SINGULAIR) 10 MG tablet Take 1 tablet (10 mg total) by mouth at bedtime. 08/21/16   Bobbitt, Heywood Iles, MD  norethindrone-ethinyl estradiol Brooks Sailors) 0.4-35 MG-MCG tablet Take 1 tablet by mouth daily. Reported on 10/07/2015    [provider]  Olopatadine HCl (PAZEO) 0.7 % SOLN Place 1 drop into both eyes 1 day or 1 dose. Patient not taking: Reported on 08/21/2016 10/07/15   Bobbitt, Heywood Iles, MD   Meds Ordered and Administered this Visit  Medications - No data to display  BP 113/78   Pulse 86   Temp 98.8 F (37.1 C) (  Oral)   Resp 16   Ht 5\' 4"  (1.626 m)   Wt 195 lb (88.5 kg)   LMP 12/24/2016   SpO2 99%   BMI 33.47 kg/m  No data found.   Physical Exam  Constitutional: She appears well-developed and well-nourished.  Eyes: Conjunctivae are normal.  Cardiovascular: Normal rate, regular rhythm, normal heart sounds and normal pulses.   Pulmonary/Chest: Effort normal and breath sounds normal. She has no wheezes. She has no rhonchi. She has no rales.  Abdominal:  No  suprapubic pain.   Musculoskeletal:       Lumbar back: She exhibits normal range of motion, no tenderness and no bony tenderness.       Back:  Low back ache as described by  Patient.  Neurological: She is alert.  Skin: Skin is warm and dry.  Psychiatric: She has a normal mood and affect. Her speech is normal and behavior is normal. Thought content normal.  Vitals reviewed.   Urgent Care Course     Procedures (including critical care time)  Labs Review Labs Reviewed  POCT I-STAT, CHEM 8  POCT PREGNANCY, URINE    Imaging Review No results found.    MDM   1. Vaginal bleeding    Patient is well-appearing and hemodynamically stable. Hgb, hct stable. Neg urine hcg. Back pain consistent with  Mild cramps. No CVA tenderness, dysuria.   Discussed with her etiology of her menorrhagia is nonspecific at this time. Differentials include: side effect of nexplanon, or monophasic OCP,  leiomyomas, adenomyosis which would require further evaluation. Patient understood this.  Referral to women's. Advised OTC iron supplement Return precautions given.    Allegra Granarnett, Margaret G, FNP 12/29/16 1827

## 2016-12-29 NOTE — ED Triage Notes (Signed)
PT reports her menstrual started Sunday. PT reports menstrual is a lot heavier than normal. PT reports fatigue and dizziness all week. PT reports she has had nexplanon in for 1.5 years. For the last 1.5 months, PT has taken oral birth control pills as well. PT reports she stopped taking birth control pills on Sunday because her menstrual started.

## 2017-09-22 ENCOUNTER — Other Ambulatory Visit: Payer: Self-pay | Admitting: Allergy and Immunology

## 2017-09-22 DIAGNOSIS — J3089 Other allergic rhinitis: Secondary | ICD-10-CM

## 2017-09-22 DIAGNOSIS — J4530 Mild persistent asthma, uncomplicated: Secondary | ICD-10-CM

## 2017-09-24 NOTE — Telephone Encounter (Signed)
Courtesy refill  

## 2017-11-02 ENCOUNTER — Other Ambulatory Visit (HOSPITAL_COMMUNITY)
Admission: RE | Admit: 2017-11-02 | Discharge: 2017-11-02 | Disposition: A | Discharge status: Home / Self Care | Payer: 59 | Source: Ambulatory Visit | Attending: Family Medicine | Admitting: Family Medicine

## 2017-11-02 ENCOUNTER — Other Ambulatory Visit: Payer: Self-pay | Admitting: Physician Assistant

## 2017-11-02 DIAGNOSIS — Z124 Encounter for screening for malignant neoplasm of cervix: Secondary | ICD-10-CM | POA: Insufficient documentation

## 2017-11-08 LAB — CYTOLOGY - PAP: DIAGNOSIS: NEGATIVE

## 2017-11-09 ENCOUNTER — Other Ambulatory Visit: Payer: Self-pay | Admitting: Allergy and Immunology

## 2017-11-09 DIAGNOSIS — J4530 Mild persistent asthma, uncomplicated: Secondary | ICD-10-CM

## 2017-11-09 DIAGNOSIS — J3089 Other allergic rhinitis: Secondary | ICD-10-CM

## 2017-11-14 ENCOUNTER — Other Ambulatory Visit: Payer: Self-pay | Admitting: Allergy and Immunology

## 2017-11-14 DIAGNOSIS — J4530 Mild persistent asthma, uncomplicated: Secondary | ICD-10-CM

## 2017-11-14 DIAGNOSIS — J3089 Other allergic rhinitis: Secondary | ICD-10-CM

## 2017-11-16 ENCOUNTER — Other Ambulatory Visit: Payer: Self-pay

## 2017-11-16 DIAGNOSIS — J3089 Other allergic rhinitis: Secondary | ICD-10-CM

## 2017-11-16 DIAGNOSIS — J4530 Mild persistent asthma, uncomplicated: Secondary | ICD-10-CM

## 2017-11-22 ENCOUNTER — Other Ambulatory Visit: Payer: Self-pay | Admitting: *Deleted

## 2017-11-22 DIAGNOSIS — J3089 Other allergic rhinitis: Secondary | ICD-10-CM

## 2017-11-22 DIAGNOSIS — J4530 Mild persistent asthma, uncomplicated: Secondary | ICD-10-CM

## 2017-11-22 NOTE — Telephone Encounter (Signed)
Denied montelukast last seen 08/2016.

## 2017-12-13 ENCOUNTER — Encounter (HOSPITAL_COMMUNITY): Payer: Self-pay

## 2017-12-13 ENCOUNTER — Ambulatory Visit (HOSPITAL_COMMUNITY)
Admission: EM | Admit: 2017-12-13 | Discharge: 2017-12-13 | Disposition: A | Discharge status: Home / Self Care | Payer: 59 | Attending: Family Medicine | Admitting: Family Medicine

## 2017-12-13 ENCOUNTER — Other Ambulatory Visit: Payer: Self-pay | Admitting: Family Medicine

## 2017-12-13 DIAGNOSIS — J4530 Mild persistent asthma, uncomplicated: Secondary | ICD-10-CM

## 2017-12-13 DIAGNOSIS — J4541 Moderate persistent asthma with (acute) exacerbation: Secondary | ICD-10-CM | POA: Diagnosis not present

## 2017-12-13 DIAGNOSIS — J3089 Other allergic rhinitis: Secondary | ICD-10-CM

## 2017-12-13 MED ORDER — IPRATROPIUM-ALBUTEROL 0.5-2.5 (3) MG/3ML IN SOLN
3.0000 mL | Freq: Once | RESPIRATORY_TRACT | Status: AC
  Administered 2017-12-13: 3 mL via RESPIRATORY_TRACT

## 2017-12-13 MED ORDER — MONTELUKAST SODIUM 10 MG PO TABS
10.0000 mg | ORAL_TABLET | Freq: Every day | ORAL | 0 refills | Status: AC
Start: 2017-12-13 — End: ?

## 2017-12-13 MED ORDER — BECLOMETHASONE DIPROPIONATE 40 MCG/ACT IN AERS: 2.0000 | INHALATION_SPRAY | Freq: Two times a day (BID) | RESPIRATORY_TRACT | 5 refills | Status: AC

## 2017-12-13 MED ORDER — PREDNISONE 20 MG PO TABS: 20.0000 mg | ORAL_TABLET | Freq: Two times a day (BID) | ORAL | 0 refills | Status: AC

## 2017-12-13 MED ORDER — IPRATROPIUM-ALBUTEROL 0.5-2.5 (3) MG/3ML IN SOLN
RESPIRATORY_TRACT | Status: AC
  Filled 2017-12-13: qty 3

## 2017-12-13 NOTE — Discharge Instructions (Signed)
Use the beclomethasone (Qvar) inhaler daily Take the montelukast (Singulair) daily Take the levo cetirizine (Xyzal) daily Use the albuterol inhaler as needed for shortness of breath Take prednisone twice a day for 5 days Push fluids Follow-up with your allergy and asthma doctor

## 2017-12-13 NOTE — ED Provider Notes (Signed)
[facility]    CSN: [id number] Arrival date & time: 12/13/17  1340     History   Chief Complaint Chief Complaint  Patient presents with  . Asthma    HPI Marilyn Wiley is a 27 y.o. female.   HPI  Patient with known asthma and allergies.  Has not seen her asthma doctor in 15 months.  Is out of her QVAR.  Is not taking her Xyzal.  Is only using montelukast and albuterol inhaler.  Has dyspnea for a week.  Getting worse.  Not responding to the albuterol.  Cough.  No sputum.  No fever or chills or recent illness. Here requesting a "breathing treatment."   Had to leave work due to feeling short of breath and tired  Past Medical History:  Diagnosis Date  . Anemia   . Asthma   . Hypertension     Patient Active Problem List   Diagnosis Date Noted  . Perennial and seasonal allergic rhinitis 10/07/2015  . Seasonal allergic conjunctivitis 10/07/2015  . Mild persistent asthma 10/07/2015    History reviewed. No pertinent surgical history.  OB History   None      Home Medications    Prior to Admission medications   Medication Sig Start Date End Date Taking? Authorizing Provider  ACZONE 7.5 % GEL 1 APPLICATION TO AFFECTED AREA ONCE A DAY BEDTIME EXTERNALLY 30 DAYS 08/16/16   [provider]  Albuterol Sulfate (PROAIR RESPICLICK) 108 (90 Base) MCG/ACT AEPB Inhale 2 puffs into the lungs every 4 (four) hours as needed. 10/07/15   Bobbitt, Heywood Iles, MD  beclomethasone (QVAR) 40 MCG/ACT inhaler Inhale 2 puffs into the lungs 2 (two) times daily. 12/13/17   Eustace Moore, MD  fluticasone [facility]) 50 MCG/ACT nasal spray Place 2 sprays into both nostrils daily. 08/21/16   Bobbitt, Heywood Iles, MD  levocetirizine (XYZAL) 5 MG tablet Take 1 tablet (5 mg total) by mouth daily. 08/21/16   Bobbitt, Heywood Iles, MD  montelukast (SINGULAIR) 10 MG tablet Take 1 tablet (10 mg total) by mouth at bedtime. 12/13/17   Eustace Moore, MD  norethindrone-ethinyl estradiol  Brooks Sailors) 0.4-35 MG-MCG tablet Take 1 tablet by mouth daily. Reported on 10/07/2015    [provider]  predniSONE (DELTASONE) 20 MG tablet Take 1 tablet (20 mg total) by mouth 2 (two) times daily with a meal. 12/13/17   Eustace Moore, MD    Family History Family History  Problem Relation Age of Onset  . Diabetes Mother   . Asthma Mother   . Diabetes Other   . Cancer Other   . Asthma Brother     Social History Social History   Tobacco Use  . Smoking status: Former Smoker    Types: Cigarettes  . Tobacco comment: hooka smoking  Substance Use Topics  . Alcohol use: Yes    Alcohol/week: 0.0 oz    Comment: social   . Drug use: No     Allergies   Patient has no known allergies.   Review of Systems Review of Systems  Constitutional: Positive for fatigue. Negative for chills and fever.  HENT: Negative for ear pain and sore throat.   Eyes: Negative for pain and visual disturbance.  Respiratory: Positive for cough, shortness of breath and wheezing.   Cardiovascular: Negative for chest pain and palpitations.  Gastrointestinal: Negative for abdominal pain and vomiting.  Genitourinary: Negative for dysuria and hematuria.  Musculoskeletal: Negative for arthralgias and back pain.  Skin: Negative  for color change and rash.  Neurological: Negative for seizures, syncope and headaches.  All other systems reviewed and are negative.    Physical Exam Triage Vital Signs ED Triage Vitals  Enc Vitals Group     BP 12/13/17 1422 (!) 124/48     Pulse Rate 12/13/17 1422 78     Resp 12/13/17 1422 20     Temp 12/13/17 1422 98 F (36.7 C)     Temp Source 12/13/17 1422 Oral     SpO2 12/13/17 1422 (!) 78 %     Weight --      Height --      Head Circumference --      Peak Flow --      Pain Score 12/13/17 1421 6     Pain Loc --      Pain Edu? --      Excl. in GC? --    No data found.  Updated Vital Signs BP (!) 124/48 (BP Location: Left Arm)   Pulse  78   Temp 98 F (36.7 C) (Oral)   Resp 20   LMP 11/15/2017   SpO2 (!) 78%   Visual Acuity Right Eye Distance:   Left Eye Distance:   Bilateral Distance:    Right Eye Near:   Left Eye Near:    Bilateral Near:     Physical Exam  Constitutional: She appears well-developed and well-nourished. No distress.  HENT:  Head: Normocephalic and atraumatic.  Right Ear: External ear normal.  Left Ear: External ear normal.  Mouth/Throat: Oropharynx is clear and moist.  Eyes: Pupils are equal, round, and reactive to light. Conjunctivae are normal.  Neck: Normal range of motion.  Cardiovascular: Normal rate, regular rhythm and normal heart sounds.  Pulmonary/Chest: Effort normal. No respiratory distress. She has wheezes.  Scattered wheezes both bases initially.  After DuoNeb treatment her lungs are clear  Abdominal: Soft. Bowel sounds are normal. She exhibits no distension.  Musculoskeletal: Normal range of motion. She exhibits no edema.  Lymphadenopathy:    She has no cervical adenopathy.  Neurological: She is alert.  Skin: Skin is warm and dry.     UC Treatments / Results  Labs (all labs ordered are listed, but only abnormal results are displayed) Labs Reviewed - No data to display  EKG None  Radiology No results found.  Procedures Procedures (including critical care time)  Medications Ordered in UC Medications  ipratropium-albuterol (DUONEB) 0.5-2.5 (3) MG/3ML nebulizer solution 3 mL (3 mLs Nebulization Given 12/13/17 1452)    Initial Impression / Assessment and Plan / UC Course  I have reviewed the triage vital signs and the nursing notes.  Pertinent labs & imaging results that were available during my care of the patient were reviewed by me and considered in my medical decision making (see chart for details).     Reviewed patient's medications.  She needs to be on the Qvar daily.  Needs to be on her allergy medicine and Singulair daily.  Needs to use the albuterol  only as needed.  I am giving her 5 days of prednisone for her flare.  There is no indication for antibiotics.  She should follow-up with her allergy doctor at least once a year to remain on appropriate allergy prevention medicines Final Clinical Impressions(s) / UC Diagnoses   Final diagnoses:  Moderate persistent asthma with exacerbation     Discharge Instructions     Use the beclomethasone (Qvar) inhaler daily Take the montelukast (Singulair) daily Take  the levo cetirizine (Xyzal) daily Use the albuterol inhaler as needed for shortness of breath Take prednisone twice a day for 5 days Push fluids Follow-up with your allergy and asthma doctor   ED Prescriptions    Medication Sig Dispense Auth. Provider   beclomethasone (QVAR) 40 MCG/ACT inhaler Inhale 2 puffs into the lungs 2 (two) times daily. 1 Inhaler Eustace MooreNelson, Yvonne Sue, MD   montelukast (SINGULAIR) 10 MG tablet Take 1 tablet (10 mg total) by mouth at bedtime. 30 tablet Eustace MooreNelson, Yvonne Sue, MD   predniSONE (DELTASONE) 20 MG tablet Take 1 tablet (20 mg total) by mouth 2 (two) times daily with a meal. 10 tablet Eustace MooreNelson, Yvonne Sue, MD     Controlled Substance Prescriptions [place] Controlled Substance Registry consulted? Not Applicable   Eustace MooreNelson, Yvonne Sue, MD 12/13/17 1524

## 2017-12-13 NOTE — ED Triage Notes (Signed)
Pt presents with asthma issues that is not relieved with inhaler.

## 2017-12-17 ENCOUNTER — Other Ambulatory Visit: Payer: Self-pay | Admitting: Family Medicine

## 2017-12-17 DIAGNOSIS — J4530 Mild persistent asthma, uncomplicated: Secondary | ICD-10-CM

## 2017-12-31 ENCOUNTER — Ambulatory Visit: Payer: 59 | Admitting: Allergy and Immunology

## 2017-12-31 ENCOUNTER — Encounter: Payer: Self-pay | Admitting: Allergy and Immunology

## 2017-12-31 VITALS — BP 120/70 | HR 61 | Temp 98.4°F | Resp 16 | Ht 64.0 in | Wt 195.0 lb

## 2017-12-31 DIAGNOSIS — J4530 Mild persistent asthma, uncomplicated: Secondary | ICD-10-CM | POA: Diagnosis not present

## 2017-12-31 DIAGNOSIS — H1010 Acute atopic conjunctivitis, unspecified eye: Secondary | ICD-10-CM | POA: Diagnosis not present

## 2017-12-31 DIAGNOSIS — J3089 Other allergic rhinitis: Secondary | ICD-10-CM

## 2017-12-31 MED ORDER — FLUTICASONE PROPIONATE HFA 110 MCG/ACT IN AERO: 2.0000 | INHALATION_SPRAY | Freq: Two times a day (BID) | RESPIRATORY_TRACT | 3 refills | Status: AC

## 2017-12-31 MED ORDER — OLOPATADINE HCL 0.2 % OP SOLN: 1.0000 [drp] | Freq: Every day | OPHTHALMIC | 5 refills | Status: AC

## 2017-12-31 MED ORDER — LEVOCETIRIZINE DIHYDROCHLORIDE 5 MG PO TABS: 5.0000 mg | ORAL_TABLET | Freq: Every evening | ORAL | 5 refills | Status: AC

## 2017-12-31 NOTE — Patient Instructions (Addendum)
Mild persistent asthma  Daily use of montelukast 10 mg has been encouraged.  During respiratory tract infections or asthma flares, add Flovent 110g 3 inhalations via spacer device 2 times per day until symptoms have returned to baseline.  Continue albuterol, 1 to 2 inhalations every 6 hours if needed.  Subjective and objective measures of pulmonary function will be followed and the treatment plan will be adjusted accordingly.  Perennial and seasonal allergic rhinitis Stable.  Continue appropriate allergen avoidance measures, levocetirizine 5 g daily as needed and fluticasone nasal spray, 1-2 sprays per nostril daily as needed.  If allergen avoidance measures and medications fail to adequately relieve symptoms, aeroallergen immunotherapy will be considered.  Seasonal allergic conjunctivitis  Treatment plan as outlined above for allergic rhinitis.  A prescription has been provided for Pataday, one drop per eye daily as needed.  I have also recommended eye lubricant drops (i.e., Natural Tears) as needed.   Return in about 5 months (around 06/02/2018), or if symptoms worsen or fail to improve.

## 2017-12-31 NOTE — Assessment & Plan Note (Signed)
Stable.  Continue appropriate allergen avoidance measures, levocetirizine 5 g daily as needed and fluticasone nasal spray, 1-2 sprays per nostril daily as needed.  If allergen avoidance measures and medications fail to adequately relieve symptoms, aeroallergen immunotherapy will be considered.

## 2017-12-31 NOTE — Assessment & Plan Note (Signed)
   Treatment plan as outlined above for allergic rhinitis.  A prescription has been provided for Pataday, one drop per eye daily as needed.  I have also recommended eye lubricant drops (i.e., Natural Tears) as needed. 

## 2017-12-31 NOTE — Progress Notes (Signed)
Follow-up Note  RE: Marilyn Wiley MRN: [id number] DOB: 1990/12/17 Date of Office Visit: 12/31/2017  Primary care provider: Milus Height, PA-C Referring provider: Milus Height, PA-C  History of present illness: Marilyn Wiley is a 27 y.o. female with persistent asthma and allergic rhinitis. She reports that in early July 2019 she had a mild asthma exacerbation requiring evaluation/treatment at the local urgent care and a prednisone burst/taper.  Since that time, her asthma symptoms have been well controlled.  She rarely requires albuterol rescue and does not experience nocturnal awakenings due to lower respiratory symptoms.  She admits that she does not take her montelukast on a consistent basis.  She has no nasal or ocular allergy symptom complaints today.  Assessment and plan: Mild persistent asthma  Daily use of montelukast 10 mg has been encouraged.  During respiratory tract infections or asthma flares, add Flovent 110g 3 inhalations via spacer device 2 times per day until symptoms have returned to baseline.  Continue albuterol, 1 to 2 inhalations every 6 hours if needed.  Subjective and objective measures of pulmonary function will be followed and the treatment plan will be adjusted accordingly.  Perennial and seasonal allergic rhinitis Stable.  Continue appropriate allergen avoidance measures, levocetirizine 5 g daily as needed and fluticasone nasal spray, 1-2 sprays per nostril daily as needed.  If allergen avoidance measures and medications fail to adequately relieve symptoms, aeroallergen immunotherapy will be considered.  Seasonal allergic conjunctivitis  Treatment plan as outlined above for allergic rhinitis.  A prescription has been provided for Pataday, one drop per eye daily as needed.  I have also recommended eye lubricant drops (i.e., Natural Tears) as needed.   Meds ordered this encounter  Medications  . fluticasone (FLOVENT HFA) 110 MCG/ACT  inhaler    Sig: Inhale 2 puffs into the lungs 2 (two) times daily.    Dispense:  1 Inhaler    Refill:  3  . levocetirizine (XYZAL) 5 MG tablet    Sig: Take 1 tablet (5 mg total) by mouth every evening.    Dispense:  30 tablet    Refill:  5  . Olopatadine HCl (PATADAY) 0.2 % SOLN    Sig: Place 1 drop into both eyes daily.    Dispense:  1 Bottle    Refill:  5    Diagnostics: Spirometry reveals an FVC of 3.  5 9 L and an FEV1 of 2.60 L (92% predicted) with an FEV1 ratio of 86%.  Please see scanned spirometry results for details.    Physical examination: Blood pressure 120/70, pulse 61, temperature 98.4 F (36.9 C), temperature source Oral, resp. rate 16, height 5\' 4"  (1.626 m), weight 195 lb (88.5 kg).  General: Alert, interactive, in no acute distress. HEENT: TMs pearly gray, turbinates mildly edematous with clear discharge, post-pharynx unremarkable. Neck: Supple without lymphadenopathy. Lungs: Clear to auscultation without wheezing, rhonchi or rales. CV: Normal S1, S2 without murmurs. Skin: Warm and dry, without lesions or rashes.  The following portions of the patient's history were reviewed and updated as appropriate: allergies, current medications, past family history, past medical history, past social history, past surgical history and problem list.  Allergies as of 12/31/2017   No Known Allergies     Medication List        Accurate as of 12/31/17  5:17 PM. Always use your most recent med list.          Albuterol Sulfate 108 (90 Base) MCG/ACT Aepb Commonly known as:  PROAIR RESPICLICK Inhale 2 puffs into the lungs every 4 (four) hours as needed.   beclomethasone 40 MCG/ACT inhaler Commonly known as:  QVAR Inhale 2 puffs into the lungs 2 (two) times daily.   fluticasone 110 MCG/ACT inhaler Commonly known as:  FLOVENT HFA Inhale 2 puffs into the lungs 2 (two) times daily.   fluticasone 50 MCG/ACT nasal spray Commonly known as:  FLONASE Place 2 sprays into both  nostrils daily.   levocetirizine 5 MG tablet Commonly known as:  XYZAL Take 1 tablet (5 mg total) by mouth every evening.   montelukast 10 MG tablet Commonly known as:  SINGULAIR Take 1 tablet (10 mg total) by mouth at bedtime.   Olopatadine HCl 0.2 % Soln Commonly known as:  PATADAY Place 1 drop into both eyes daily.   predniSONE 20 MG tablet Commonly known as:  DELTASONE Take 1 tablet (20 mg total) by mouth 2 (two) times daily with a meal.   XULANE 150-35 MCG/24HR transdermal patch Generic drug:  norelgestromin-ethinyl estradiol 1 PATCH(S) ONCE A WEEK FOR 3 WEEKS       No Known Allergies  Review of systems: Review of systems negative except as noted in HPI / PMHx or noted below: Constitutional: Negative.  HENT: Negative.   Eyes: Negative.  Respiratory: Negative.   Cardiovascular: Negative.  Gastrointestinal: Negative.  Genitourinary: Negative.  Musculoskeletal: Negative.  Neurological: Negative.  Endo/Heme/Allergies: Negative.  Cutaneous: Negative.  Past Medical History:  Diagnosis Date  . Anemia   . Asthma   . Hypertension     Family History  Problem Relation Age of Onset  . Diabetes Mother   . Asthma Mother   . Diabetes Other   . Cancer Other   . Asthma Brother     Social History   Socioeconomic History  . Marital status: Single    Spouse name: Not on file  . Number of children: Not on file  . Years of education: Not on file  . Highest education level: Not on file  Occupational History  . Not on file  Social Needs  . Financial resource strain: Not on file  . Food insecurity:    Worry: Not on file    Inability: Not on file  . Transportation needs:    Medical: Not on file    Non-medical: Not on file  Tobacco Use  . Smoking status: Former Smoker    Types: Cigarettes  . Tobacco comment: hooka smoking  Substance and Sexual Activity  . Alcohol use: Yes    Alcohol/week: 0.0 oz    Comment: social   . Drug use: No  . Sexual activity: Not  on file  Lifestyle  . Physical activity:    Days per week: Not on file    Minutes per session: Not on file  . Stress: Not on file  Relationships  . Social connections:    Talks on phone: Not on file    Gets together: Not on file    Attends religious service: Not on file    Active member of club or organization: Not on file    Attends meetings of clubs or organizations: Not on file    Relationship status: Not on file  . Intimate partner violence:    Fear of current or ex partner: Not on file    Emotionally abused: Not on file    Physically abused: Not on file    Forced sexual activity: Not on file  Other Topics Concern  . Not on  file  Social History Narrative  . Not on file    I appreciate the opportunity to take part in Ilona's care. Please do not hesitate to contact me with questions.  Sincerely,   R. Jorene Guestarter Lynix Bonine, MD

## 2017-12-31 NOTE — Assessment & Plan Note (Signed)
   Daily use of montelukast 10 mg has been encouraged.  During respiratory tract infections or asthma flares, add Flovent 110g 3 inhalations via spacer device 2 times per day until symptoms have returned to baseline.  Continue albuterol, 1 to 2 inhalations every 6 hours if needed.  Subjective and objective measures of pulmonary function will be followed and the treatment plan will be adjusted accordingly.

## 2018-01-13 ENCOUNTER — Other Ambulatory Visit: Payer: Self-pay | Admitting: Family Medicine

## 2018-01-13 DIAGNOSIS — J3089 Other allergic rhinitis: Secondary | ICD-10-CM

## 2018-01-13 DIAGNOSIS — J4530 Mild persistent asthma, uncomplicated: Secondary | ICD-10-CM

## 2018-01-27 ENCOUNTER — Other Ambulatory Visit: Payer: Self-pay | Admitting: Family Medicine

## 2018-01-27 DIAGNOSIS — J4530 Mild persistent asthma, uncomplicated: Secondary | ICD-10-CM

## 2018-01-27 DIAGNOSIS — J3089 Other allergic rhinitis: Secondary | ICD-10-CM
